# Patient Record
Sex: Female | Born: 1985 | Race: White | Hispanic: No | Marital: Single | Smoking: Former smoker
Health system: Southern US, Community
[De-identification: ages and names within clinical notes are randomized; demographics above are authoritative.]

## PROBLEM LIST (undated history)

## (undated) ENCOUNTER — Inpatient Hospital Stay (HOSPITAL_COMMUNITY): Payer: Self-pay

## (undated) DIAGNOSIS — R87629 Unspecified abnormal cytological findings in specimens from vagina: Secondary | ICD-10-CM

## (undated) DIAGNOSIS — R519 Headache, unspecified: Secondary | ICD-10-CM

## (undated) DIAGNOSIS — R51 Headache: Secondary | ICD-10-CM

## (undated) HISTORY — PX: KNEE SURGERY: SHX244

## (undated) HISTORY — DX: Unspecified abnormal cytological findings in specimens from vagina: R87.629

---

## 2014-09-15 ENCOUNTER — Encounter (HOSPITAL_COMMUNITY): Payer: Self-pay | Admitting: Emergency Medicine

## 2014-09-15 ENCOUNTER — Emergency Department (HOSPITAL_COMMUNITY)
Admission: EM | Admit: 2014-09-15 | Discharge: 2014-09-15 | Disposition: A | Discharge status: Home / Self Care | Payer: Self-pay | Attending: Emergency Medicine | Admitting: Emergency Medicine

## 2014-09-15 DIAGNOSIS — H9202 Otalgia, left ear: Secondary | ICD-10-CM | POA: Insufficient documentation

## 2014-09-15 DIAGNOSIS — J029 Acute pharyngitis, unspecified: Secondary | ICD-10-CM

## 2014-09-15 DIAGNOSIS — Z720 Tobacco use: Secondary | ICD-10-CM | POA: Insufficient documentation

## 2014-09-15 DIAGNOSIS — R05 Cough: Secondary | ICD-10-CM | POA: Insufficient documentation

## 2014-09-15 DIAGNOSIS — Z880 Allergy status to penicillin: Secondary | ICD-10-CM | POA: Insufficient documentation

## 2014-09-15 DIAGNOSIS — R0981 Nasal congestion: Secondary | ICD-10-CM | POA: Insufficient documentation

## 2014-09-15 LAB — RAPID STREP SCREEN (MED CTR MEBANE ONLY): Streptococcus, Group A Screen (Direct): NEGATIVE

## 2014-09-15 NOTE — ED Notes (Signed)
Patient here with complaint of right sided throat pain which began about 8 days ago. States that initially it was only her throat then her left ear began to hurt. States low grade fevers at onset, but they haven't returned. Right tonsil appears swollen with white streaks. Afebrile in triage.

## 2014-09-15 NOTE — ED Provider Notes (Signed)
CSN: [id number]     Arrival date & time 09/15/14  1853 History  This chart was scribed for Joycie Peek, PA-C working with No att. providers found by Elveria Rising, ED Scribe. This patient was seen in room TR08C/TR08C and the patient's care was started at 7:48 PM.   Chief Complaint  Patient presents with  . Sore Throat  . Otalgia   The history is provided by the patient. No language interpreter was used.   HPI Comments: Monica Le is a 29 y.o. female who presents to the Emergency Department complaining of burning sore throat, onset eight days ago. Patient reports onset of associated left ear pain six days ago, resolved low grade fevers, congestion and cough. Patient reports treatment with ibuprofen and Dayquil, without relief. Patient reports that she suspected her symptoms were viral and that she was trying to wait it out, but after it seemed that her other symptoms were improving her sore throat has worsened over the last two days. Denies any difficulties swallowing, breathing. Denies difficulty moving jaw   History reviewed. No pertinent past medical history. History reviewed. No pertinent past surgical history. History reviewed. No pertinent family history. History  Substance Use Topics  . Smoking status: Current Some Day Smoker  . Smokeless tobacco: Not on file  . Alcohol Use: No   OB History    No data available     Review of Systems  Constitutional: Negative for fever and chills.  HENT: Positive for congestion.   Respiratory: Positive for cough. Negative for shortness of breath.   Cardiovascular: Negative for chest pain.  Gastrointestinal: Negative for abdominal pain.   Allergies  Penicillins  Home Medications   Prior to Admission medications   Not on File   Triage Vitals: BP 141/91 mmHg  Pulse 96  Temp(Src) 98.4 F (36.9 C) (Oral)  Resp 22  SpO2 98%  LMP 09/08/2014 (Exact Date) Physical Exam  Constitutional: She is oriented to person, place, and time. She  appears well-developed and well-nourished. No distress.  HENT:  Head: Normocephalic and atraumatic.  Mildly erythematous oropharynx. No tonsillar exudate. No unilateral tonsillar swelling. No trismus. No glossal elevation. No cervical adenopathy.  No auricular lymphadenopathy. No tenderness to mastoid.  Patient handling secretions well. Patent airway  Eyes: EOM are normal.  Neck: Neck supple. No tracheal deviation present.  Cardiovascular: Normal rate, regular rhythm, S1 normal, S2 normal and normal heart sounds.   Pulmonary/Chest: Effort normal and breath sounds normal. No respiratory distress.  Clear to auscultation bilaterally.   Musculoskeletal: Normal range of motion.  Lymphadenopathy:    She has no cervical adenopathy.  Neurological: She is alert and oriented to person, place, and time.  Skin: Skin is warm and dry.  Psychiatric: She has a normal mood and affect. Her behavior is normal.  Nursing note and vitals reviewed.   ED Course  Procedures (including critical care time)  COORDINATION OF CARE: 7:56 PM- Discussed treatment plan with patient at bedside and patient agreed to plan.   Labs Review Labs Reviewed  RAPID STREP SCREEN  CULTURE, GROUP A STREP    Imaging Review No results found.   EKG Interpretation None     Meds given in ED:  Medications - No data to display  There are no discharge medications for this patient.  Filed Vitals:   09/15/14 1914 09/15/14 2131  BP: 141/91 115/80  Pulse: 96 77  Temp: 98.4 F (36.9 C)   TempSrc: Oral   Resp: 22 16  SpO2:  98% 99%    MDM  Vitals stable - WNL -afebrile Pt resting comfortably in ED. PE--mildly erythematous oropharynx with no unilateral tonsillar swelling, glossal elevation or trismus. Labwork--rapid strep negative  DDX--patient likely with viral pharyngitis. No temperature, she does report mild cough, no cervical adenopathy, no tonsillar exudate's. No evidence of peritonsillar abscess or Ludwig angina.  Patient is stable to follow-up with her PCP. Discussed symptomatic care  I discussed all relevant lab findings and imaging results with pt and they verbalized understanding. Discussed f/u with PCP within 48 hrs and return precautions, pt very amenable to plan.  Final diagnoses:  Pharyngitis    I personally performed the services described in this documentation, which was scribed in my presence. The recorded information has been reviewed and is accurate.    Earle GellBenjamin W Gladevilleartner, PA-C 09/16/14 0127  Arby BarretteMarcy Pfeiffer, MD 09/18/14 [phone]

## 2014-09-15 NOTE — Discharge Instructions (Signed)
°  You were evaluated in the ED today for your sore throat. There is not appear to be an emergent or bacterial cause of your sore throat at this time. It is important for you to follow-up with your primary care, [facility] for further evaluation and management of your symptoms. You may continue to do symptomatic support with salt water gargles, cough drops and lozenges, Chloraseptic spray. Return to ED for new or worsening symptoms

## 2014-09-15 NOTE — ED Notes (Signed)
PA at BS.  

## 2014-09-18 LAB — CULTURE, GROUP A STREP

## 2014-12-19 ENCOUNTER — Encounter (HOSPITAL_COMMUNITY): Payer: Self-pay | Admitting: Emergency Medicine

## 2014-12-19 ENCOUNTER — Emergency Department (HOSPITAL_COMMUNITY)
Admission: EM | Admit: 2014-12-19 | Discharge: 2014-12-19 | Disposition: A | Discharge status: Home / Self Care | Payer: Self-pay | Attending: Emergency Medicine | Admitting: Emergency Medicine

## 2014-12-19 ENCOUNTER — Emergency Department (HOSPITAL_COMMUNITY): Payer: Self-pay

## 2014-12-19 DIAGNOSIS — Z880 Allergy status to penicillin: Secondary | ICD-10-CM | POA: Insufficient documentation

## 2014-12-19 DIAGNOSIS — Z3A08 8 weeks gestation of pregnancy: Secondary | ICD-10-CM | POA: Insufficient documentation

## 2014-12-19 DIAGNOSIS — F17210 Nicotine dependence, cigarettes, uncomplicated: Secondary | ICD-10-CM | POA: Insufficient documentation

## 2014-12-19 DIAGNOSIS — O210 Mild hyperemesis gravidarum: Secondary | ICD-10-CM | POA: Insufficient documentation

## 2014-12-19 DIAGNOSIS — O4690 Antepartum hemorrhage, unspecified, unspecified trimester: Secondary | ICD-10-CM

## 2014-12-19 DIAGNOSIS — O26899 Other specified pregnancy related conditions, unspecified trimester: Secondary | ICD-10-CM

## 2014-12-19 DIAGNOSIS — O209 Hemorrhage in early pregnancy, unspecified: Secondary | ICD-10-CM | POA: Insufficient documentation

## 2014-12-19 DIAGNOSIS — R102 Pelvic and perineal pain: Secondary | ICD-10-CM

## 2014-12-19 DIAGNOSIS — Z3491 Encounter for supervision of normal pregnancy, unspecified, first trimester: Secondary | ICD-10-CM

## 2014-12-19 DIAGNOSIS — O99331 Smoking (tobacco) complicating pregnancy, first trimester: Secondary | ICD-10-CM | POA: Insufficient documentation

## 2014-12-19 LAB — HCG, QUANTITATIVE, PREGNANCY: hCG, Beta Chain, Quant, S: 148617 m[IU]/mL — ABNORMAL HIGH (ref <5)

## 2014-12-19 LAB — WET PREP, GENITAL
CLUE CELLS WET PREP: NONE SEEN
TRICH WET PREP: NONE SEEN
Yeast Wet Prep HPF POC: NONE SEEN

## 2014-12-19 LAB — PREGNANCY, URINE: Preg Test, Ur: POSITIVE — AB

## 2014-12-19 MED ORDER — PRENATAL COMPLETE 14-0.4 MG PO TABS: ORAL_TABLET | ORAL | Status: AC

## 2014-12-19 NOTE — Discharge Instructions (Signed)
Vaginal Bleeding During Pregnancy, First Trimester  A small amount of bleeding (spotting) from the vagina is relatively common in early pregnancy. It usually stops on its own. Various things may cause bleeding or spotting in early pregnancy. Some bleeding may be related to the pregnancy, and some may not. In most cases, the bleeding is normal and is not a problem. However, bleeding can also be a sign of something serious. Be sure to tell your health care provider about any vaginal bleeding right away.  Some possible causes of vaginal bleeding during the first trimester include:  · Infection or inflammation of the cervix.  · Growths (polyps) on the cervix.  · Miscarriage or threatened miscarriage.  · Pregnancy tissue has developed outside of the uterus and in a fallopian tube (tubal pregnancy).  · Tiny cysts have developed in the uterus instead of pregnancy tissue (molar pregnancy).  HOME CARE INSTRUCTIONS   Watch your condition for any changes. The following actions may help to lessen any discomfort you are feeling:  · Follow your health care provider's instructions for limiting your activity. If your health care provider orders bed rest, you may need to stay in bed and only get up to use the bathroom. However, your health care provider may allow you to continue light activity.  · If needed, make plans for someone to help with your regular activities and responsibilities while you are on bed rest.  · Keep track of the number of pads you use each day, how often you change pads, and how soaked (saturated) they are. Write this down.  · Do not use tampons. Do not douche.  · Do not have sexual intercourse or orgasms until approved by your health care provider.  · If you pass any tissue from your vagina, save the tissue so you can show it to your health care provider.  · Only take over-the-counter or prescription medicines as directed by your health care provider.  · Do not take aspirin because it can make you  bleed.  · Keep all follow-up appointments as directed by your health care provider.  SEEK MEDICAL CARE IF:  · You have any vaginal bleeding during any part of your pregnancy.  · You have cramps or labor pains.  · You have a fever, not controlled by medicine.  SEEK IMMEDIATE MEDICAL CARE IF:   · You have severe cramps in your back or belly (abdomen).  · You pass large clots or tissue from your vagina.  · Your bleeding increases.  · You feel light-headed or weak, or you have fainting episodes.  · You have chills.  · You are leaking fluid or have a gush of fluid from your vagina.  · You pass out while having a bowel movement.  MAKE SURE YOU:  · Understand these instructions.  · Will watch your condition.  · Will get help right away if you are not doing well or get worse.  Document Released: 05/06/2005 Document Revised: 08/01/2013 Document Reviewed: 04/03/2013  ExitCare® Patient Information ©2015 [employer]. This information is not intended to replace advice given to you by your health care provider. Make sure you discuss any questions you have with your health care provider.

## 2014-12-19 NOTE — ED Notes (Signed)
Pt states that past couple weeks been having intermittent vaginal bleeding and thinks she is around 7-[redacted] weeks pregnant.   Pt states that today she she had light brownish color discharge and not enough to wear a pad with.  Pt states that she is having breast tenderness, worsening morning symptoms and fatigue as well over the past couple weeks.

## 2014-12-19 NOTE — ED Provider Notes (Signed)
CSN: [id number]     Arrival date & time 12/19/14  1444 History   First MD Initiated Contact with Patient 12/19/14 1705     Chief Complaint  Patient presents with  . Vaginal Bleeding  . Abdominal Cramping  . Routine Prenatal Visit  . Morning Sickness     (Consider location/radiation/quality/duration/timing/severity/associated sxs/prior Treatment) Patient is a 29 y.o. female presenting with vaginal bleeding. The history is provided by the patient. No language interpreter was used.  Vaginal Bleeding Quality:  Bright red Severity:  Moderate Onset quality:  Gradual Duration:  2 weeks Timing:  Constant Progression:  Worsening Chronicity:  New Number of pads used:  0 Number of tampons used:  0 Possible pregnancy: yes   Relieved by:  Nothing Worsened by:  Nothing tried Ineffective treatments:  None tried Associated symptoms: no fever   Risk factors: prior miscarriage   Risk factors: no bleeding disorder   Pt is early pregnant.  Pt reports she has been spotting for the past week  History reviewed. No pertinent past medical history. Past Surgical History  Procedure Laterality Date  . Cesarean section     No family history on file. History  Substance Use Topics  . Smoking status: Current Some Day Smoker  . Smokeless tobacco: Not on file  . Alcohol Use: No   OB History    Gravida Para Term Preterm AB TAB SAB Ectopic Multiple Living   1              Review of Systems  Constitutional: Negative for fever.  Genitourinary: Positive for vaginal bleeding.  All other systems reviewed and are negative.     Allergies  Penicillins  Home Medications   Prior to Admission medications   Medication Sig Start Date End Date Taking? Authorizing Provider  ibuprofen (ADVIL,MOTRIN) 200 MG tablet Take 200-400 mg by mouth every 6 (six) hours as needed.   Yes Historical Provider, MD   BP 140/79 mmHg  Pulse 81  Temp(Src) 98.2 F (36.8 C) (Oral)  Resp 18  Ht 5\' 6"  (1.676 m)  Wt 173  lb 4 oz (78.586 kg)  BMI 27.98 kg/m2  SpO2 100%  LMP 10/29/2014 Physical Exam  Constitutional: She appears well-developed and well-nourished.  HENT:  Head: Normocephalic.  Eyes: Conjunctivae are normal. Pupils are equal, round, and reactive to light.  Neck: Neck supple.  Cardiovascular: Normal rate and regular rhythm.   Pulmonary/Chest: Effort normal and breath sounds normal.  Abdominal: Soft.  Genitourinary: Vaginal discharge found.  Uterus enlarged, nontender, adnexa no masses  Musculoskeletal: Normal range of motion.  Neurological: She is alert.  Skin: Skin is warm.  Nursing note and vitals reviewed.   ED Course  Procedures (including critical care time) Labs Review Labs Reviewed  PREGNANCY, URINE - Abnormal; Notable for the following:    Preg Test, Ur POSITIVE (*)    All other components within normal limits  WET PREP, GENITAL  GC/CHLAMYDIA PROBE AMP ([place])    Imaging Review [place] Ob Comp Less 14 Wks  12/19/2014   CLINICAL DATA:  Vaginal bleeding  EXAM: OBSTETRIC <14 WK US AND TRANSVAGINAL OB US  TECHNIQUE: Both transabdominal and transvaginal ultrasound examinations were performed for complete evaluation of the gestation as well as the maternal uterus, adnexal regions, and pelvic cul-de-sac. Transvaginal technique was performed to assess early pregnancy.  COMPARISON:  None.  FINDINGS: Intrauterine gestational sac: Visualized/normal in shape.  Yolk sac:  Present  Embryo:  Present  Cardiac Activity: Present  Heart  Rate: 149  bpm  CRL:  13  mm   7 w   4 d                  US EDC: 08/03/2015  Maternal uterus/adnexae: There is a small subchorionic hemorrhage measuring 1.1 x 0.5 x 1.3 cm. The right ovary measures 5.1 x 3.3 x 3.7 cm. The left ovary is not visualized. There is no pelvic free fluid.  IMPRESSION: 1. Single live intrauterine pregnancy dating 7 weeks 4 days with an ultrasound EDC of 08/03/2015. 2. Small subchorionic hemorrhage measuring 1.1 x 0.5 x 1.3 cm.    Electronically Signed   By: Elige KoHetal  Patel   On: 12/19/2014 19:58   [place] Ob Transvaginal  12/19/2014   CLINICAL DATA:  Vaginal bleeding  EXAM: OBSTETRIC <14 WK US AND TRANSVAGINAL OB US  TECHNIQUE: Both transabdominal and transvaginal ultrasound examinations were performed for complete evaluation of the gestation as well as the maternal uterus, adnexal regions, and pelvic cul-de-sac. Transvaginal technique was performed to assess early pregnancy.  COMPARISON:  None.  FINDINGS: Intrauterine gestational sac: Visualized/normal in shape.  Yolk sac:  Present  Embryo:  Present  Cardiac Activity: Present  Heart Rate: 149  bpm  CRL:  13  mm   7 w   4 d                  US EDC: 08/03/2015  Maternal uterus/adnexae: There is a small subchorionic hemorrhage measuring 1.1 x 0.5 x 1.3 cm. The right ovary measures 5.1 x 3.3 x 3.7 cm. The left ovary is not visualized. There is no pelvic free fluid.  IMPRESSION: 1. Single live intrauterine pregnancy dating 7 weeks 4 days with an ultrasound EDC of 08/03/2015. 2. Small subchorionic hemorrhage measuring 1.1 x 0.5 x 1.3 cm.   Electronically Signed   By: Elige KoHetal  Patel   On: 12/19/2014 19:58     EKG Interpretation None      MDM  Pt advised of subchorionic hemorrhage.   [facility] if any problems.  Schedule prenatal care.   Final diagnoses:  Vaginal bleeding in pregnancy, first trimester        Elson AreasLeslie K Sofia, PA-C 12/19/14 2303  Linwood DibblesJon Knapp, MD 12/19/14 [phone]

## 2014-12-19 NOTE — ED Notes (Signed)
Vaginal discharge for a few days. Pt admits to possibility of being pregnant.

## 2014-12-20 LAB — GC/CHLAMYDIA PROBE AMP (~~LOC~~) NOT AT ARMC
CHLAMYDIA, DNA PROBE: NEGATIVE
Neisseria Gonorrhea: NEGATIVE

## 2015-02-28 ENCOUNTER — Encounter: Payer: Self-pay | Admitting: Family Medicine

## 2015-05-28 ENCOUNTER — Inpatient Hospital Stay (HOSPITAL_COMMUNITY)
Admission: AD | Admit: 2015-05-28 | Discharge: 2015-05-28 | Disposition: A | Discharge status: Home / Self Care | Payer: Medicaid Other | Source: Ambulatory Visit | Attending: Obstetrics and Gynecology | Admitting: Obstetrics and Gynecology

## 2015-05-28 ENCOUNTER — Encounter (HOSPITAL_COMMUNITY): Payer: Self-pay | Admitting: *Deleted

## 2015-05-28 DIAGNOSIS — O34219 Maternal care for unspecified type scar from previous cesarean delivery: Secondary | ICD-10-CM | POA: Insufficient documentation

## 2015-05-28 DIAGNOSIS — Z880 Allergy status to penicillin: Secondary | ICD-10-CM | POA: Diagnosis not present

## 2015-05-28 DIAGNOSIS — Z3A29 29 weeks gestation of pregnancy: Secondary | ICD-10-CM | POA: Insufficient documentation

## 2015-05-28 HISTORY — DX: Headache: R51

## 2015-05-28 HISTORY — DX: Headache, unspecified: R51.9

## 2015-05-28 MED ORDER — BETAMETHASONE SOD PHOS & ACET 6 (3-3) MG/ML IJ SUSP
12.0000 mg | Freq: Once | INTRAMUSCULAR | Status: AC
  Administered 2015-05-28: 12 mg via INTRAMUSCULAR
  Filled 2015-05-28: qty 2

## 2015-05-28 NOTE — Discharge Instructions (Signed)
Keep your scheduled appointment for prenatal care. °

## 2015-05-28 NOTE — Discharge Summary (Addendum)
Obstetric Discharge Summary  Date of admission:  05/28/2015  Date of discharge:  05/28/2015  Admission diagnosis:  [redacted] week gestation  Preterm contractions  Positive fetal fibronectin  History of prior cesarean delivery  Discharge diagnosis:  Same  Procedures this admission:  Nonstress test  Injection of betamethasone  History and Physical Exam   Ms. Monica Le is a 29 y.o. female, G1P0, at Unknown gestation, who presents for a nonstress test and for an injection of betamethasone. [facility]  Her pregnancy has been complicated by a history of a prior cesarean section for twin gestation. She was seen in the office for preterm contractions. Her fetal fibronectin test was positive. See history below.  OB History    Gravida Para Term Preterm AB TAB SAB Ectopic Multiple Living   1               No past medical history on file.  Prescriptions prior to admission  Medication Sig Dispense Refill Last Dose  . ibuprofen (ADVIL,MOTRIN) 200 MG tablet Take 200-400 mg by mouth every 6 (six) hours as needed.   Past Month at Unknown time  . Prenatal Vit-Fe Fumarate-FA (PRENATAL COMPLETE) 14-0.4 MG TABS One a day 60 each 2     Past Surgical History  Procedure Laterality Date  . Cesarean section      Allergies  Allergen Reactions  . Penicillins Anaphylaxis    Any type of "cillin"    Family History: family history is not on file.  Social History:  reports that she has been smoking.  She does not have any smokeless tobacco history on file. She reports that she does not drink alcohol or use illicit drugs.  Review of systems: Normal pregnancy complaints.  Admission Physical Exam:    There is no weight on file to calculate BMI.  Last menstrual period 10/29/2014.  HEENT:                 Within normal limits Chest:                   Clear Heart:                     Regular rate and rhythm Abdomen:             Gravid and nontender Extremities:          Grossly normal Neurologic exam: Grossly normal Pelvic exam:         Cervix: Closed and long in the office  Prenatal labs: ABO, Rh:               O-. The patient has received Rhophylac HBsAg:                   nonreactive HIV:                         nonreactive GBS:                       pending Rubella:                  immune RPR:                           Prenatal Transfer Tool  Maternal Diabetes: No Genetic Screening: Declined Maternal  Ultrasounds/Referrals: Normal Fetal Ultrasounds or other Referrals:  None Maternal Substance Abuse:  No Significant Maternal Medications:  Procardia Significant Maternal Lab Results:  Positive fetal fibronectin Other Comments:  The patient has received her flu shot and her TDAP vaccination  Assessment:  [redacted] week gestation  Preterm uterine contractions  Positive fetal fibronectin  No evidence of preterm labor  Plan:  The patient will receive betamethasone today and a second shot in 24 hours. Gonorrhea, Chlamydia, and beta strep culture sent. Preterm labor precautions given.  Discharge Information: Date: 05/28/2015 Activity: unrestricted Diet: routine Medications: Procardia as needed for contractions Condition: stable Instructions: Preterm labor precautions given Discharge to: home   Janine Limbo 05/28/2015, 6:32 PM   Addendum: 1610  Nurse call reports reactive NST Strip Reviewed 145 bpm, Mod Var, -Decels, +10x10 Accels Okay to discharge to home Nurse to give bleeding and labor precautions Patient to return, tomorrow, for 2nd BMZ dosage   Monica Merk LYNN MSN, CNM  05/28/2015 7:18 PM

## 2015-05-28 NOTE — Progress Notes (Signed)
Dr. Stefano GaulStringer in to talk with patient.

## 2015-05-28 NOTE — MAU Note (Signed)
Pt has +fFN, sent in for betamethasone, to return tomorrow for 2nd dose

## 2015-05-29 ENCOUNTER — Inpatient Hospital Stay (HOSPITAL_COMMUNITY)
Admission: AD | Admit: 2015-05-29 | Discharge: 2015-05-29 | Disposition: A | Discharge status: Home / Self Care | Payer: Medicaid Other | Source: Ambulatory Visit | Attending: Obstetrics and Gynecology | Admitting: Obstetrics and Gynecology

## 2015-05-29 DIAGNOSIS — Z3A31 31 weeks gestation of pregnancy: Secondary | ICD-10-CM | POA: Insufficient documentation

## 2015-05-29 LAB — CULTURE, OB URINE: Culture: NO GROWTH

## 2015-05-29 LAB — GC/CHLAMYDIA PROBE AMP (~~LOC~~) NOT AT ARMC
CHLAMYDIA, DNA PROBE: NEGATIVE
Neisseria Gonorrhea: NEGATIVE

## 2015-05-29 MED ORDER — BETAMETHASONE SOD PHOS & ACET 6 (3-3) MG/ML IJ SUSP
12.0000 mg | Freq: Once | INTRAMUSCULAR | Status: AC
  Administered 2015-05-29: 12 mg via INTRAMUSCULAR
  Filled 2015-05-29: qty 2

## 2015-07-03 ENCOUNTER — Other Ambulatory Visit: Payer: Self-pay | Admitting: Obstetrics and Gynecology

## 2015-07-17 ENCOUNTER — Inpatient Hospital Stay (HOSPITAL_COMMUNITY)
Admission: AD | Admit: 2015-07-17 | Discharge: 2015-07-20 | DRG: 766 | Disposition: A | Discharge status: Home / Self Care | Payer: Medicaid Other | Source: Ambulatory Visit | Attending: Obstetrics and Gynecology | Admitting: Obstetrics and Gynecology

## 2015-07-17 ENCOUNTER — Inpatient Hospital Stay (HOSPITAL_COMMUNITY): Payer: Medicaid Other

## 2015-07-17 ENCOUNTER — Encounter (HOSPITAL_COMMUNITY): Payer: Self-pay

## 2015-07-17 DIAGNOSIS — Z833 Family history of diabetes mellitus: Secondary | ICD-10-CM

## 2015-07-17 DIAGNOSIS — Z3A37 37 weeks gestation of pregnancy: Secondary | ICD-10-CM

## 2015-07-17 DIAGNOSIS — O34211 Maternal care for low transverse scar from previous cesarean delivery: Principal | ICD-10-CM | POA: Diagnosis present

## 2015-07-17 DIAGNOSIS — Z87891 Personal history of nicotine dependence: Secondary | ICD-10-CM

## 2015-07-17 DIAGNOSIS — Z6791 Unspecified blood type, Rh negative: Secondary | ICD-10-CM | POA: Diagnosis present

## 2015-07-17 DIAGNOSIS — O09219 Supervision of pregnancy with history of pre-term labor, unspecified trimester: Secondary | ICD-10-CM

## 2015-07-17 DIAGNOSIS — O09899 Supervision of other high risk pregnancies, unspecified trimester: Secondary | ICD-10-CM

## 2015-07-17 DIAGNOSIS — Z880 Allergy status to penicillin: Secondary | ICD-10-CM

## 2015-07-17 DIAGNOSIS — O09299 Supervision of pregnancy with other poor reproductive or obstetric history, unspecified trimester: Secondary | ICD-10-CM

## 2015-07-17 DIAGNOSIS — O26893 Other specified pregnancy related conditions, third trimester: Secondary | ICD-10-CM | POA: Diagnosis present

## 2015-07-17 DIAGNOSIS — Z8279 Family history of other congenital malformations, deformations and chromosomal abnormalities: Secondary | ICD-10-CM

## 2015-07-17 DIAGNOSIS — Z302 Encounter for sterilization: Secondary | ICD-10-CM

## 2015-07-17 DIAGNOSIS — Z98891 History of uterine scar from previous surgery: Secondary | ICD-10-CM

## 2015-07-17 LAB — URINALYSIS, ROUTINE W REFLEX MICROSCOPIC
BILIRUBIN URINE: NEGATIVE
Glucose, UA: NEGATIVE mg/dL
Hgb urine dipstick: NEGATIVE
KETONES UR: 15 mg/dL — AB
NITRITE: NEGATIVE
PH: 7 (ref 5.0–8.0)
Protein, ur: NEGATIVE mg/dL
Specific Gravity, Urine: 1.02 (ref 1.005–1.030)

## 2015-07-17 LAB — URINE MICROSCOPIC-ADD ON

## 2015-07-17 LAB — KLEIHAUER-BETKE STAIN
# Vials RhIg: 2
Fetal Cells %: 0.4 %
QUANTITATION FETAL HEMOGLOBIN: 20 mL

## 2015-07-17 MED ORDER — ACETAMINOPHEN 325 MG PO TABS: 650.0000 mg | ORAL_TABLET | ORAL | Status: DC | PRN

## 2015-07-17 MED ORDER — CALCIUM CARBONATE ANTACID 500 MG PO CHEW: 2.0000 | CHEWABLE_TABLET | ORAL | Status: DC | PRN

## 2015-07-17 MED ORDER — FENTANYL CITRATE (PF) 100 MCG/2ML IJ SOLN
50.0000 ug | INTRAMUSCULAR | Status: DC | PRN
  Administered 2015-07-17: 100 ug via INTRAVENOUS
  Filled 2015-07-17: qty 2

## 2015-07-17 MED ORDER — PRENATAL MULTIVITAMIN CH: 1.0000 | ORAL_TABLET | Freq: Every day | ORAL | Status: DC

## 2015-07-17 MED ORDER — DOCUSATE SODIUM 100 MG PO CAPS: 100.0000 mg | ORAL_CAPSULE | Freq: Every day | ORAL | Status: DC

## 2015-07-17 MED ORDER — ZOLPIDEM TARTRATE 5 MG PO TABS: 5.0000 mg | ORAL_TABLET | Freq: Every evening | ORAL | Status: DC | PRN

## 2015-07-17 MED ORDER — LACTATED RINGERS IV SOLN
INTRAVENOUS | Status: DC
  Administered 2015-07-17 – 2015-07-18 (×2): via INTRAVENOUS

## 2015-07-17 MED ORDER — LACTATED RINGERS IV BOLUS (SEPSIS)
500.0000 mL | Freq: Once | INTRAVENOUS | Status: AC
  Administered 2015-07-17: 500 mL via INTRAVENOUS

## 2015-07-17 MED ORDER — LACTATED RINGERS IV SOLN
INTRAVENOUS | Status: DC
  Administered 2015-07-18 (×3): via INTRAVENOUS
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
## 2015-07-18 ENCOUNTER — Encounter (HOSPITAL_COMMUNITY): Payer: Self-pay | Admitting: *Deleted

## 2015-07-18 ENCOUNTER — Inpatient Hospital Stay (HOSPITAL_COMMUNITY): Payer: Medicaid Other | Admitting: Anesthesiology

## 2015-07-18 ENCOUNTER — Encounter (HOSPITAL_COMMUNITY): Payer: Self-pay | Admitting: Anesthesiology

## 2015-07-18 ENCOUNTER — Encounter (HOSPITAL_COMMUNITY)
Admission: AD | Disposition: A | Discharge status: Home / Self Care | Payer: Self-pay | Source: Ambulatory Visit | Attending: Obstetrics and Gynecology

## 2015-07-18 DIAGNOSIS — Z880 Allergy status to penicillin: Secondary | ICD-10-CM | POA: Diagnosis not present

## 2015-07-18 DIAGNOSIS — Z3A37 37 weeks gestation of pregnancy: Secondary | ICD-10-CM | POA: Diagnosis not present

## 2015-07-18 DIAGNOSIS — Z6791 Unspecified blood type, Rh negative: Secondary | ICD-10-CM | POA: Diagnosis not present

## 2015-07-18 DIAGNOSIS — O26893 Other specified pregnancy related conditions, third trimester: Secondary | ICD-10-CM | POA: Diagnosis present

## 2015-07-18 DIAGNOSIS — O34211 Maternal care for low transverse scar from previous cesarean delivery: Secondary | ICD-10-CM | POA: Diagnosis present

## 2015-07-18 DIAGNOSIS — Z87891 Personal history of nicotine dependence: Secondary | ICD-10-CM | POA: Diagnosis not present

## 2015-07-18 DIAGNOSIS — Z302 Encounter for sterilization: Secondary | ICD-10-CM | POA: Diagnosis not present

## 2015-07-18 DIAGNOSIS — Z833 Family history of diabetes mellitus: Secondary | ICD-10-CM | POA: Diagnosis not present

## 2015-07-18 HISTORY — PX: TUBAL LIGATION: SHX77

## 2015-07-18 LAB — CBC
HEMATOCRIT: 36.2 % (ref 36.0–46.0)
HEMOGLOBIN: 12.5 g/dL (ref 12.0–15.0)
MCH: 32.1 pg (ref 26.0–34.0)
MCHC: 34.5 g/dL (ref 30.0–36.0)
MCV: 93.1 fL (ref 78.0–100.0)
Platelets: 148 10*3/uL — ABNORMAL LOW (ref 150–400)
RBC: 3.89 MIL/uL (ref 3.87–5.11)
RDW: 13.8 % (ref 11.5–15.5)
WBC: 10.9 10*3/uL — AB (ref 4.0–10.5)

## 2015-07-18 SURGERY — Surgical Case
Anesthesia: Spinal | Site: Abdomen

## 2015-07-18 MED ORDER — DIBUCAINE 1 % RE OINT: 1.0000 "application " | TOPICAL_OINTMENT | RECTAL | Status: DC | PRN

## 2015-07-18 MED ORDER — ACETAMINOPHEN 325 MG PO TABS: 650.0000 mg | ORAL_TABLET | ORAL | Status: DC | PRN

## 2015-07-18 MED ORDER — NALBUPHINE HCL 10 MG/ML IJ SOLN: 5.0000 mg | Freq: Once | INTRAMUSCULAR | Status: DC | PRN

## 2015-07-18 MED ORDER — ONDANSETRON HCL 4 MG/2ML IJ SOLN: 4.0000 mg | Freq: Three times a day (TID) | INTRAMUSCULAR | Status: DC | PRN

## 2015-07-18 MED ORDER — KETOROLAC TROMETHAMINE 30 MG/ML IJ SOLN: 30.0000 mg | Freq: Four times a day (QID) | INTRAMUSCULAR | Status: AC | PRN

## 2015-07-18 MED ORDER — LACTATED RINGERS IV SOLN
INTRAVENOUS | Status: DC
  Administered 2015-07-19: 02:00:00 via INTRAVENOUS

## 2015-07-18 MED ORDER — LANOLIN HYDROUS EX OINT: 1.0000 "application " | TOPICAL_OINTMENT | CUTANEOUS | Status: DC | PRN

## 2015-07-18 MED ORDER — SODIUM CHLORIDE 0.9 % IJ SOLN: 3.0000 mL | INTRAMUSCULAR | Status: DC | PRN

## 2015-07-18 MED ORDER — PROMETHAZINE HCL 25 MG/ML IJ SOLN: 6.2500 mg | INTRAMUSCULAR | Status: DC | PRN

## 2015-07-18 MED ORDER — DIPHENHYDRAMINE HCL 25 MG PO CAPS
25.0000 mg | ORAL_CAPSULE | Freq: Four times a day (QID) | ORAL | Status: DC | PRN
  Filled 2015-07-18: qty 1

## 2015-07-18 MED ORDER — OXYTOCIN 40 UNITS IN LACTATED RINGERS INFUSION - SIMPLE MED: 62.5000 mL/h | INTRAVENOUS | Status: AC

## 2015-07-18 MED ORDER — MEPERIDINE HCL 25 MG/ML IJ SOLN: 6.2500 mg | INTRAMUSCULAR | Status: DC | PRN

## 2015-07-18 MED ORDER — OXYCODONE-ACETAMINOPHEN 5-325 MG PO TABS
1.0000 | ORAL_TABLET | ORAL | Status: DC | PRN
  Administered 2015-07-19 – 2015-07-20 (×3): 1 via ORAL
  Filled 2015-07-18 (×3): qty 1

## 2015-07-18 MED ORDER — IBUPROFEN 600 MG PO TABS
600.0000 mg | ORAL_TABLET | Freq: Four times a day (QID) | ORAL | Status: DC
  Administered 2015-07-18 – 2015-07-20 (×7): 600 mg via ORAL
  Filled 2015-07-18 (×7): qty 1

## 2015-07-18 MED ORDER — MENTHOL 3 MG MT LOZG: 1.0000 | LOZENGE | OROMUCOSAL | Status: DC | PRN

## 2015-07-18 MED ORDER — ONDANSETRON HCL 4 MG/2ML IJ SOLN
INTRAMUSCULAR | Status: DC | PRN
Start: 2015-07-18 — End: 2015-07-18
  Administered 2015-07-18: 4 mg via INTRAVENOUS

## 2015-07-18 MED ORDER — NALOXONE HCL 0.4 MG/ML IJ SOLN: 0.4000 mg | INTRAMUSCULAR | Status: DC | PRN

## 2015-07-18 MED ORDER — NALOXONE HCL 2 MG/2ML IJ SOSY: 1.0000 ug/kg/h | PREFILLED_SYRINGE | INTRAVENOUS | Status: DC | PRN

## 2015-07-18 MED ORDER — FENTANYL CITRATE (PF) 100 MCG/2ML IJ SOLN
INTRAMUSCULAR | Status: DC | PRN
  Administered 2015-07-18: 10 ug via INTRATHECAL

## 2015-07-18 MED ORDER — SIMETHICONE 80 MG PO CHEW
80.0000 mg | CHEWABLE_TABLET | ORAL | Status: DC
  Administered 2015-07-18 – 2015-07-19 (×2): 80 mg via ORAL
  Filled 2015-07-18 (×2): qty 1

## 2015-07-18 MED ORDER — KETOROLAC TROMETHAMINE 30 MG/ML IJ SOLN
INTRAMUSCULAR | Status: AC
  Filled 2015-07-18: qty 1

## 2015-07-18 MED ORDER — OXYTOCIN 10 UNIT/ML IJ SOLN
INTRAMUSCULAR | Status: AC
  Filled 2015-07-18: qty 4

## 2015-07-18 MED ORDER — TETANUS-DIPHTH-ACELL PERTUSSIS 5-2.5-18.5 LF-MCG/0.5 IM SUSP: 0.5000 mL | Freq: Once | INTRAMUSCULAR | Status: DC

## 2015-07-18 MED ORDER — BUPIVACAINE IN DEXTROSE 0.75-8.25 % IT SOLN
INTRATHECAL | Status: DC | PRN
  Administered 2015-07-18: 1.7 mL via INTRATHECAL

## 2015-07-18 MED ORDER — ZOLPIDEM TARTRATE 5 MG PO TABS: 5.0000 mg | ORAL_TABLET | Freq: Every evening | ORAL | Status: DC | PRN

## 2015-07-18 MED ORDER — FENTANYL CITRATE (PF) 100 MCG/2ML IJ SOLN
INTRAMUSCULAR | Status: AC
  Filled 2015-07-18: qty 2

## 2015-07-18 MED ORDER — KETOROLAC TROMETHAMINE 30 MG/ML IJ SOLN
30.0000 mg | Freq: Four times a day (QID) | INTRAMUSCULAR | Status: AC | PRN
  Administered 2015-07-18: 30 mg via INTRAMUSCULAR

## 2015-07-18 MED ORDER — FENTANYL CITRATE (PF) 100 MCG/2ML IJ SOLN
25.0000 ug | INTRAMUSCULAR | Status: DC | PRN
  Administered 2015-07-18 (×2): 25 ug via INTRAVENOUS

## 2015-07-18 MED ORDER — SCOPOLAMINE 1 MG/3DAYS TD PT72
MEDICATED_PATCH | TRANSDERMAL | Status: AC
Start: 2015-07-18 — End: 2015-07-18
  Filled 2015-07-18: qty 1

## 2015-07-18 MED ORDER — PRENATAL MULTIVITAMIN CH
1.0000 | ORAL_TABLET | Freq: Every day | ORAL | Status: DC
  Administered 2015-07-19 – 2015-07-20 (×2): 1 via ORAL
  Filled 2015-07-18 (×2): qty 1

## 2015-07-18 MED ORDER — MORPHINE SULFATE (PF) 0.5 MG/ML IJ SOLN
INTRAMUSCULAR | Status: AC
  Filled 2015-07-18: qty 10

## 2015-07-18 MED ORDER — NALBUPHINE HCL 10 MG/ML IJ SOLN: 5.0000 mg | INTRAMUSCULAR | Status: DC | PRN

## 2015-07-18 MED ORDER — ACETAMINOPHEN 500 MG PO TABS
1000.0000 mg | ORAL_TABLET | Freq: Four times a day (QID) | ORAL | Status: AC
  Administered 2015-07-18: 1000 mg via ORAL
  Filled 2015-07-18: qty 2

## 2015-07-18 MED ORDER — OXYCODONE-ACETAMINOPHEN 5-325 MG PO TABS
2.0000 | ORAL_TABLET | ORAL | Status: DC | PRN
  Administered 2015-07-19: 2 via ORAL
  Filled 2015-07-18: qty 2

## 2015-07-18 MED ORDER — PHENYLEPHRINE 8 MG IN D5W 100 ML (0.08MG/ML) PREMIX OPTIME
INJECTION | INTRAVENOUS | Status: DC | PRN
  Administered 2015-07-18: 60 ug/min via INTRAVENOUS

## 2015-07-18 MED ORDER — SENNOSIDES-DOCUSATE SODIUM 8.6-50 MG PO TABS
2.0000 | ORAL_TABLET | ORAL | Status: DC
  Administered 2015-07-18 – 2015-07-19 (×2): 2 via ORAL
  Filled 2015-07-18 (×2): qty 2

## 2015-07-18 MED ORDER — SCOPOLAMINE 1 MG/3DAYS TD PT72
MEDICATED_PATCH | TRANSDERMAL | Status: DC | PRN
  Administered 2015-07-18: 1 via TRANSDERMAL

## 2015-07-18 MED ORDER — DIPHENHYDRAMINE HCL 25 MG PO CAPS: 25.0000 mg | ORAL_CAPSULE | ORAL | Status: DC | PRN

## 2015-07-18 MED ORDER — SCOPOLAMINE 1 MG/3DAYS TD PT72: 1.0000 | MEDICATED_PATCH | Freq: Once | TRANSDERMAL | Status: DC

## 2015-07-18 MED ORDER — MORPHINE SULFATE (PF) 0.5 MG/ML IJ SOLN
INTRAMUSCULAR | Status: DC | PRN
  Administered 2015-07-18: .2 mg via INTRATHECAL

## 2015-07-18 MED ORDER — GENTAMICIN SULFATE 40 MG/ML IJ SOLN
INTRAVENOUS | Status: AC
  Administered 2015-07-18: 115 mL via INTRAVENOUS
  Filled 2015-07-18: qty 9

## 2015-07-18 MED ORDER — SIMETHICONE 80 MG PO CHEW: 80.0000 mg | CHEWABLE_TABLET | ORAL | Status: DC | PRN

## 2015-07-18 MED ORDER — SODIUM CHLORIDE 0.9 % IR SOLN
Status: DC | PRN
  Administered 2015-07-18: 1000 mL

## 2015-07-18 MED ORDER — ONDANSETRON HCL 4 MG/2ML IJ SOLN
INTRAMUSCULAR | Status: AC
  Filled 2015-07-18: qty 2

## 2015-07-18 MED ORDER — SIMETHICONE 80 MG PO CHEW
80.0000 mg | CHEWABLE_TABLET | Freq: Three times a day (TID) | ORAL | Status: DC
  Administered 2015-07-19 – 2015-07-20 (×2): 80 mg via ORAL
  Filled 2015-07-18 (×2): qty 1

## 2015-07-18 MED ORDER — CITRIC ACID-SODIUM CITRATE 334-500 MG/5ML PO SOLN
30.0000 mL | Freq: Once | ORAL | Status: AC
  Administered 2015-07-18: 30 mL via ORAL
  Filled 2015-07-18: qty 15

## 2015-07-18 MED ORDER — WITCH HAZEL-GLYCERIN EX PADS: 1.0000 "application " | MEDICATED_PAD | CUTANEOUS | Status: DC | PRN

## 2015-07-18 MED ORDER — DIPHENHYDRAMINE HCL 50 MG/ML IJ SOLN: 12.5000 mg | INTRAMUSCULAR | Status: DC | PRN

## 2015-07-18 SURGICAL SUPPLY — 40 items
BENZOIN TINCTURE PRP APPL 2/3 (GAUZE/BANDAGES/DRESSINGS) ×4 IMPLANT
CLAMP CORD UMBIL (MISCELLANEOUS) IMPLANT
CLOSURE STERI STRIP 1/2 X4 (GAUZE/BANDAGES/DRESSINGS) ×4 IMPLANT
CLOTH BEACON ORANGE TIMEOUT ST (SAFETY) ×4 IMPLANT
CONTAINER PREFILL 10% NBF 15ML (MISCELLANEOUS) IMPLANT
DRAPE SHEET LG 3/4 BI-LAMINATE (DRAPES) IMPLANT
DRSG OPSITE POSTOP 4X10 (GAUZE/BANDAGES/DRESSINGS) ×4 IMPLANT
DURAPREP 26ML APPLICATOR (WOUND CARE) ×4 IMPLANT
ELECT REM PT RETURN 9FT ADLT (ELECTROSURGICAL) ×4
ELECTRODE REM PT RTRN 9FT ADLT (ELECTROSURGICAL) ×2 IMPLANT
EXTRACTOR VACUUM M CUP 4 TUBE (SUCTIONS) IMPLANT
EXTRACTOR VACUUM M CUP 4' TUBE (SUCTIONS)
GLOVE BIO SURGEON STRL SZ7.5 (GLOVE) ×4 IMPLANT
GLOVE BIOGEL PI IND STRL 7.0 (GLOVE) ×2 IMPLANT
GLOVE BIOGEL PI IND STRL 7.5 (GLOVE) ×2 IMPLANT
GLOVE BIOGEL PI INDICATOR 7.0 (GLOVE) ×2
GLOVE BIOGEL PI INDICATOR 7.5 (GLOVE) ×2
GOWN STRL REUS W/TWL LRG LVL3 (GOWN DISPOSABLE) ×8 IMPLANT
HEMOSTAT SURGICEL 2X3 (HEMOSTASIS) ×4 IMPLANT
KIT ABG SYR 3ML LUER SLIP (SYRINGE) IMPLANT
NEEDLE HYPO 25X5/8 SAFETYGLIDE (NEEDLE) IMPLANT
NS IRRIG 1000ML POUR BTL (IV SOLUTION) ×4 IMPLANT
PACK C SECTION WH (CUSTOM PROCEDURE TRAY) ×4 IMPLANT
PAD OB MATERNITY 4.3X12.25 (PERSONAL CARE ITEMS) ×4 IMPLANT
PENCIL SMOKE EVAC W/HOLSTER (ELECTROSURGICAL) ×4 IMPLANT
RTRCTR C-SECT PINK 25CM LRG (MISCELLANEOUS) ×4 IMPLANT
STRIP CLOSURE SKIN 1/2X4 (GAUZE/BANDAGES/DRESSINGS) ×3 IMPLANT
SUT CHROMIC 2 0 CT 1 (SUTURE) ×4 IMPLANT
SUT MNCRL AB 3-0 PS2 27 (SUTURE) ×4 IMPLANT
SUT PLAIN 0 NONE (SUTURE) IMPLANT
SUT PLAIN 2 0 XLH (SUTURE) ×4 IMPLANT
SUT VIC AB 0 CT1 36 (SUTURE) ×4 IMPLANT
SUT VIC AB 0 CTX 36 (SUTURE) ×6
SUT VIC AB 0 CTX36XBRD ANBCTRL (SUTURE) ×6 IMPLANT
SUT VIC AB 2-0 SH 27 (SUTURE) ×8
SUT VIC AB 2-0 SH 27XBRD (SUTURE) ×8 IMPLANT
SUT VIC AB 3-0 SH 27 (SUTURE) ×2
SUT VIC AB 3-0 SH 27X BRD (SUTURE) ×2 IMPLANT
TOWEL OR 17X24 6PK STRL BLUE (TOWEL DISPOSABLE) ×4 IMPLANT
TRAY FOLEY CATH SILVER 14FR (SET/KITS/TRAYS/PACK) ×4 IMPLANT
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
## 2015-07-18 NOTE — Transfer of Care (Signed)
Immediate Anesthesia Transfer of Care Note  Patient: Monica Le  Procedure(s) Performed: Procedure(s): CESAREAN SECTION (N/A) BILATERAL TUBAL LIGATION (Bilateral)  Patient Location: PACU  Anesthesia Type:Spinal  Level of Consciousness: awake, alert  and oriented  Airway & Oxygen Therapy: Patient Spontanous Breathing  Post-op Assessment: Report given to RN and Post -op Vital signs reviewed and stable  Post vital signs: Reviewed and stable  Last Vitals:  Filed Vitals:   07/18/15 0800 07/18/15 1157  BP: 119/86 114/60  Pulse: 70 84  Temp: 37.8 C 37.1 C  Resp: 20 18    Complications: No apparent anesthesia complications
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
## 2015-07-18 NOTE — Anesthesia Postprocedure Evaluation (Signed)
Anesthesia Post Note  Patient: Monica Le  Procedure(s) Performed: Procedure(s) (LRB): CESAREAN SECTION (N/A) BILATERAL TUBAL LIGATION (Bilateral)  Patient location during evaluation: PACU Anesthesia Type: General Level of consciousness: awake and alert Pain management: pain level controlled Vital Signs Assessment: post-procedure vital signs reviewed and stable Respiratory status: spontaneous breathing, nonlabored ventilation, respiratory function stable and patient connected to nasal cannula oxygen Cardiovascular status: blood pressure returned to baseline and stable Postop Assessment: no signs of nausea or vomiting Anesthetic complications: no    Last Vitals:  Filed Vitals:   07/18/15 1530 07/18/15 1600  BP: 108/59 105/67  Pulse: 55 54  Temp:    Resp: 17 17    Last Pain:  Filed Vitals:   07/18/15 1629  PainSc: 8                  Reino KentJudd, Monica Le

## 2015-07-18 NOTE — Progress Notes (Signed)
Dr. Su Hiltoberts @ bedside discussing POC with pt.  Plan is to proceed with Cesarean Section today.  Pt verbalized understanding and agreeable to POC.
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
## 2015-07-18 NOTE — Progress Notes (Signed)
Pt to OR via stretcher.

## 2015-07-18 NOTE — Anesthesia Procedure Notes (Signed)
Spinal Patient location during procedure: OR Staffing Anesthesiologist: Claudell Wohler Performed by: anesthesiologist  Preanesthetic Checklist Completed: patient identified, site marked, surgical consent, pre-op evaluation, timeout performed, IV checked, risks and benefits discussed and monitors and equipment checked Spinal Block Patient position: sitting Prep: DuraPrep Patient monitoring: heart rate, continuous pulse ox and blood pressure Location: L4-5 Injection technique: single-shot Needle Needle type: Spinocan  Needle gauge: 24 G Needle length: 9 cm Assessment Sensory level: T6 Additional Notes Functioning IV was confirmed and monitors were applied. Sterile prep and drape, including hand hygiene, mask and sterile gloves were used. The patient was positioned and the spine was prepped. The skin was anesthetized with lidocaine.  Free flow of clear CSF was obtained prior to injecting local anesthetic into the CSF.  The spinal needle aspirated freely following injection.  The needle was carefully withdrawn.  The patient tolerated the procedure well. Consent was obtained prior to procedure with all questions answered and concerns addressed.  Monica DessertBen Ashby Moskal, MD

## 2015-07-19 ENCOUNTER — Encounter (HOSPITAL_COMMUNITY): Payer: Self-pay | Admitting: Obstetrics and Gynecology

## 2015-07-19 DIAGNOSIS — Z98891 History of uterine scar from previous surgery: Secondary | ICD-10-CM

## 2015-07-19 LAB — CBC
HEMATOCRIT: 32 % — AB (ref 36.0–46.0)
Hemoglobin: 10.8 g/dL — ABNORMAL LOW (ref 12.0–15.0)
MCH: 31.6 pg (ref 26.0–34.0)
MCHC: 33.8 g/dL (ref 30.0–36.0)
MCV: 93.6 fL (ref 78.0–100.0)
PLATELETS: 127 10*3/uL — AB (ref 150–400)
RBC: 3.42 MIL/uL — AB (ref 3.87–5.11)
RDW: 13.9 % (ref 11.5–15.5)
WBC: 9.8 10*3/uL (ref 4.0–10.5)

## 2015-07-19 LAB — RPR: RPR: NONREACTIVE

## 2015-07-19 LAB — HIV ANTIBODY (ROUTINE TESTING W REFLEX): HIV SCREEN 4TH GENERATION: NONREACTIVE

## 2015-07-19 MED ORDER — LACTATED RINGERS IV BOLUS (SEPSIS)
500.0000 mL | Freq: Once | INTRAVENOUS | Status: AC
  Administered 2015-07-19: 500 mL via INTRAVENOUS
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
## 2015-07-19 NOTE — Lactation Note (Addendum)
This note was copied from the chart of [name]. Lactation Consultation Note  P4, 37 weeks.  > 7 lbs..  Baby 19 hours. Reviewed hand expression w/ mother and she was able to express drops from both breasts. States she has history of low milk supply and had cracks and severe soreness w/ previous children latching. Attempted latching with and without NS but baby at this time does not open wide enough to latch. Reviewed sucking training and jaw massage. Mother has not started pumping.  Encouraged mother to post pump w/ DEBP 10-15 min at least 4-6x a day to stimulate her milk supply. Helped mother pump for 15 min.  Drops received put on spoon for baby. Set up soapy cleaning bin for mother. Placed baby STS on mother's chest.  1400 Mother is in a lot of pain when baby latches.  Attempted with and without NS. PrUniversity OrMarland KitchenFelicity Coyererith formulaCreTampa VMarland Kitche58mnSt [facility] [id number] 2016     Maternal Data    Feeding Feeding Type: Breast Fed  LATCH Score/Interventions                      Lactation Tools Discussed/Used     Consult Status      Berkelhammer, Ruth Boschen 07/19/2015, 9:50 AM

## 2015-07-20 MED ORDER — IBUPROFEN 600 MG PO TABS: 600.0000 mg | ORAL_TABLET | Freq: Four times a day (QID) | ORAL | Status: AC | PRN

## 2015-07-20 MED ORDER — OXYCODONE-ACETAMINOPHEN 5-325 MG PO TABS: 1.0000 | ORAL_TABLET | ORAL | Status: DC | PRN

## 2015-07-20 MED ORDER — SENNOSIDES-DOCUSATE SODIUM 8.6-50 MG PO TABS: 2.0000 | ORAL_TABLET | Freq: Every evening | ORAL | Status: AC | PRN
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
## 2015-07-20 NOTE — Discharge Instructions (Signed)
Postpartum Depression and Baby Blues °The postpartum period begins right after the birth of a baby. During this time, there is often a great amount of joy and excitement. It is also a time of many changes in the life of the parents. Regardless of how many times a mother gives birth, each child brings new challenges and dynamics to the family. It is not unusual to have feelings of excitement along with confusing shifts in moods, emotions, and thoughts. All mothers are at risk of developing postpartum depression or the "baby blues." These mood changes can occur right after giving birth, or they may occur many months after giving birth. The baby blues or postpartum depression can be mild or severe. Additionally, postpartum depression can go away rather quickly, or it can be a long-term condition.  °CAUSES °Raised hormone levels and the rapid drop in those levels are thought to be a main cause of postpartum depression and the baby blues. A number of hormones change during and after pregnancy. Estrogen and progesterone usually decrease right after the delivery of your baby. The levels of thyroid hormone and various cortisol steroids also rapidly drop. Other factors that play a role in these mood changes include major life events and genetics.  °RISK FACTORS °If you have any of the following risks for the baby blues or postpartum depression, know what symptoms to watch out for during the postpartum period. Risk factors that may increase the likelihood of getting the baby blues or postpartum depression include: °· Having a personal or family history of depression.   °· Having depression while being pregnant.   °· Having premenstrual mood issues or mood issues related to oral contraceptives. °· Having a lot of life stress.   °· Having marital conflict.   °· Lacking a social support network.   °· Having a baby with special needs.   °· Having health problems, such as diabetes.   °SIGNS AND SYMPTOMS °Symptoms of baby blues  include: °· Brief changes in mood, such as going from extreme happiness to sadness. °· Decreased concentration.   °· Difficulty sleeping.   °· Crying spells, tearfulness.   °· Irritability.   °· Anxiety.   °Symptoms of postpartum depression typically begin within the first month after giving birth. These symptoms include: °· Difficulty sleeping or excessive sleepiness.   °· Marked weight loss.   °· Agitation.   °· Feelings of worthlessness.   °· Lack of interest in activity or food.   °Postpartum psychosis is a very serious condition and can be dangerous. Fortunately, it is rare. Displaying any of the following symptoms is cause for immediate medical attention. Symptoms of postpartum psychosis include:  °· Hallucinations and delusions.   °· Bizarre or disorganized behavior.   °· Confusion or disorientation.   °DIAGNOSIS  °A diagnosis is made by an evaluation of your symptoms. There are no medical or lab tests that lead to a diagnosis, but there are various questionnaires that a health care provider may use to identify those with the baby blues, postpartum depression, or psychosis. Often, a screening tool called the Edinburgh Postnatal Depression Scale is used to diagnose depression in the postpartum period.  °TREATMENT °The baby blues usually goes away on its own in 1-2 weeks. Social support is often all that is needed. You will be encouraged to get adequate sleep and rest. Occasionally, you may be given medicines to help you sleep.  °Postpartum depression requires treatment because it can last several months or longer if it is not treated. Treatment may include individual or group therapy, medicine, or both to address any social, physiological, and psychological   factors that may play a role in the depression. Regular exercise, a healthy diet, rest, and social support may also be strongly recommended.  °Postpartum psychosis is more serious and needs treatment right away. Hospitalization is often needed. °HOME CARE  INSTRUCTIONS °· Get as much rest as you can. Nap when the baby sleeps.   °· Exercise regularly. Some women find yoga and walking to be beneficial.   °· Eat a balanced and nourishing diet.   °· Do little things that you enjoy. Have a cup of tea, take a bubble bath, read your favorite magazine, or listen to your favorite music. °· Avoid alcohol.   °· Ask for help with household chores, cooking, grocery shopping, or running errands as needed. Do not try to do everything.   °· Talk to people close to you about how you are feeling. Get support from your partner, family members, friends, or other new moms. °· Try to stay positive in how you think. Think about the things you are grateful for.   °· Do not spend a lot of time alone.   °· Only take over-the-counter or prescription medicine as directed by your health care provider. °· Keep all your postpartum appointments.   °· Let your health care provider know if you have any concerns.   °SEEK MEDICAL CARE IF: °You are having a reaction to or problems with your medicine. °SEEK IMMEDIATE MEDICAL CARE IF: °· You have suicidal feelings.   °· You think you may harm the baby or someone else. °MAKE SURE YOU: °· Understand these instructions. °· Will watch your condition. °· Will get help right away if you are not doing well or get worse. °  °This information is not intended to replace advice given to you by your health care provider. Make sure you discuss any questions you have with your health care provider. °  °Document Released: 04/30/2004 Document Revised: 08/01/2013 Document Reviewed: 05/08/2013 °Elsevier Interactive Patient Education ©2016 [employer] ° °

## 2015-07-20 NOTE — Lactation Note (Signed)
This note was copied from the chart of Monica Le. Lactation Consultation Note  Patient Name: Monica Mendel CorningChelsie Mccollum UJWJX'BToday's Date: 07/20/2015 Reason for consult: Follow-up assessment;Difficult latch;Other (Comment);Pump rental (Nipple Shield use, 37 week infant)   Follow up with mom prior to D/C. Infant with 5 BF for 15-30 minutes, 1 attempt, 6 bottle feedings of Alimentum 20 cal 10-45 cc, 4 voids and 3 stools in last 24 hours. Infant born at 5437 week gestation and weighs 6 lb 14.4 oz today with weight loss of 4% since birth. Mom wished to BF and notes that she had milk supply issues with previous children. She noted that she did have breast changes with pregnancy, denies changes in breast since delivery. Breasts are small and compressible and slightly cone/tubular, nipples are flat. Positional stripes noted on both nipples. Mom reports she uses NS with each BF and that infant is doing a little better with getting her mouth open a little wider today and that pain is decreased when infant able to obtain a deeper latch.  Mom has pumped 4 x in last 24 hours, she has not received any colostrum via pump. Mom has breast shells at bedside and has not used them. She is using comfort gels. Discussed using expressed colostrum and olive or coconut oil to nipples when not using comfort gels. Mom is concerned with milk supply and wishes to rent a DEBP. Enc her to BF and or pump 8-12 x in 24 hours to provide adequate stimulation to breasts. [facility] and plans to call Monday and make appt with WIC. WIC referral form filled out with mom's assistance [facility] Made mom a F/U LC Appointment on 12/189 at 9 am with mom's permission. Reviewed BF information in Taking Care of Baby and Me Booklet. Reviewed LC Services and gave mom OP appointment reminder. Mom to call with questions/concerns.    Maternal Data Formula Feeding for Exclusion: No Has patient been taught Hand Expression?:  Yes Does the patient have breastfeeding experience prior to this delivery?: Yes  Feeding Feeding Type: Formula Length of feed: 20 min  LATCH Score/Interventions                      Lactation Tools Discussed/Used WIC Program: Yes Pump Review: Setup, frequency, and cleaning;Milk Storage   Consult Status Consult Status: Follow-up Date: 07/29/15 (9 am) Follow-up type: Out-patient    Silas FloodSharon S Blaire Palomino 07/20/2015, 11:59 AM

## 2015-07-21 LAB — TYPE AND SCREEN
ABO/RH(D): O NEG
Antibody Screen: POSITIVE
DAT, IgG: NEGATIVE
Unit division: 0
Unit division: 0

## 2015-07-29 ENCOUNTER — Ambulatory Visit (HOSPITAL_COMMUNITY): Payer: Medicaid Other

## 2015-07-30 ENCOUNTER — Inpatient Hospital Stay (HOSPITAL_COMMUNITY): Admission: RE | Admit: 2015-07-30 | Payer: Medicaid Other | Source: Ambulatory Visit

## 2015-08-01 ENCOUNTER — Inpatient Hospital Stay (HOSPITAL_COMMUNITY)
Admission: RE | Admit: 2015-08-01 | Payer: Medicaid Other | Source: Ambulatory Visit | Admitting: Obstetrics and Gynecology

## 2015-08-01 ENCOUNTER — Encounter (HOSPITAL_COMMUNITY): Admission: RE | Payer: Self-pay | Source: Ambulatory Visit

## 2015-08-01 SURGERY — Surgical Case
Anesthesia: Regional | Laterality: Bilateral

## 2017-01-03 IMAGING — US US OB COMP LESS 14 WK
1 series · 14 of 28 positions shown · non-contrast
Comparison: None.

CLINICAL DATA: Vaginal bleeding

EXAM:
OBSTETRIC <14 WK US AND TRANSVAGINAL OB US
TECHNIQUE: Both transabdominal and transvaginal ultrasound examinations were
performed for complete evaluation of the gestation as well as the
maternal uterus, adnexal regions, and pelvic cul-de-sac.
Transvaginal technique was performed to assess early pregnancy.

[Series 1: us ob comp less 14 wk · 0.21mm/px · 14 of 111 slices shown]
[im 5/111]
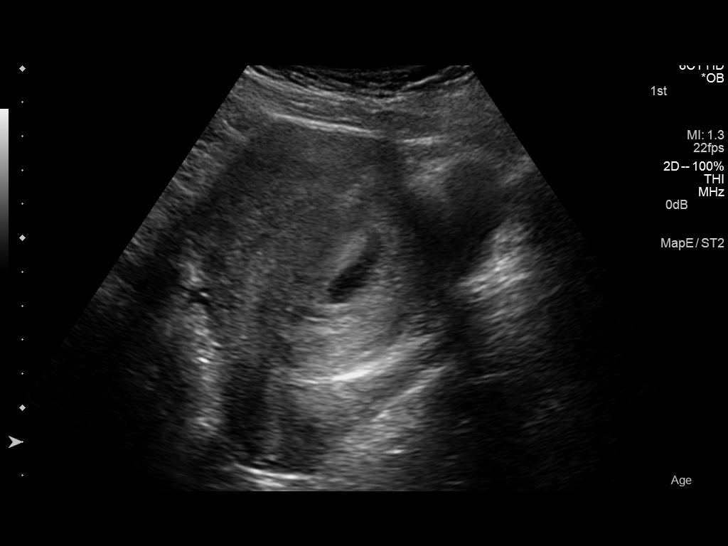
[im 13/111]
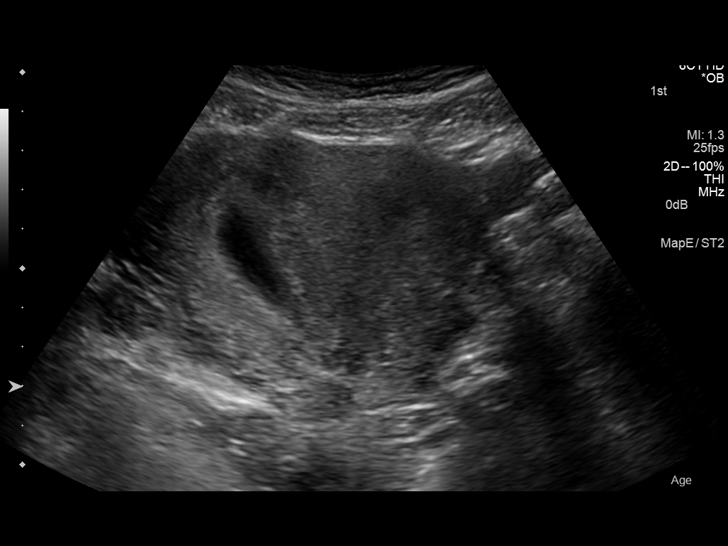
[im 21/111]
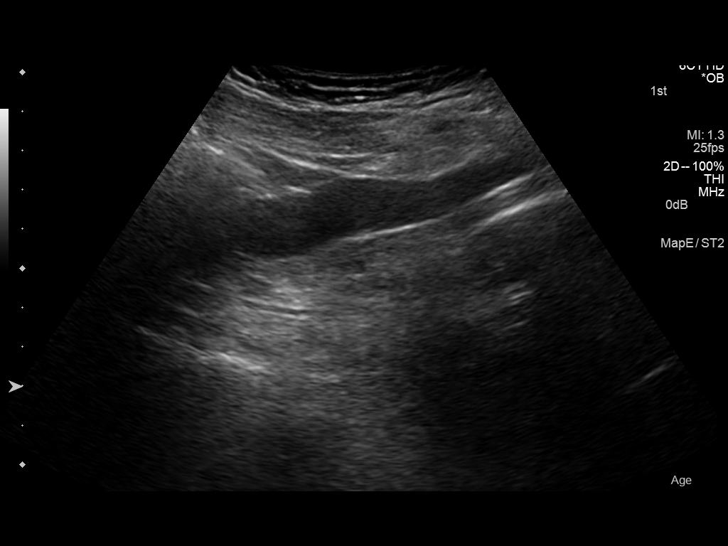
[im 29/111]
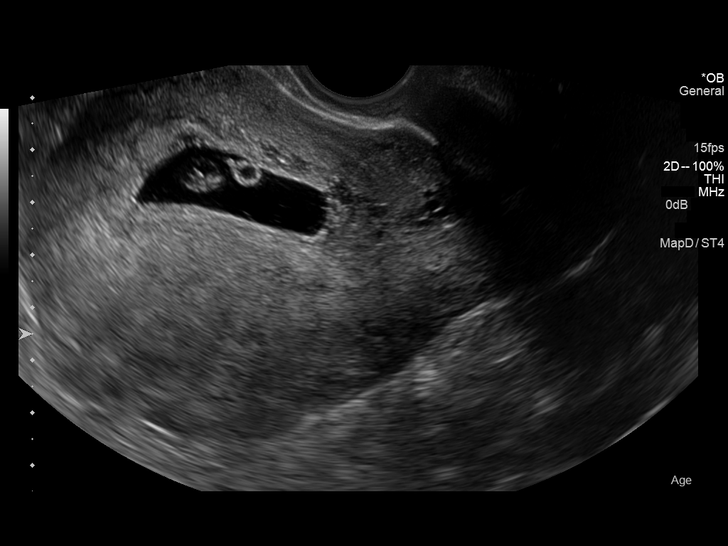
[im 37/111]
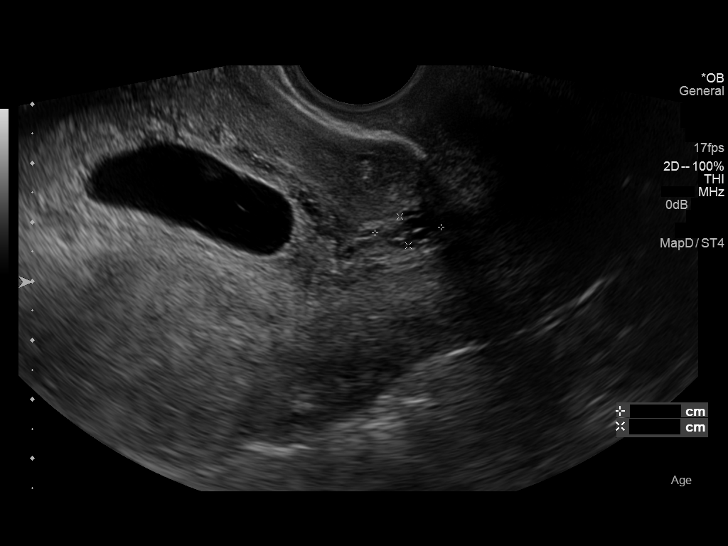
[im 45/111]
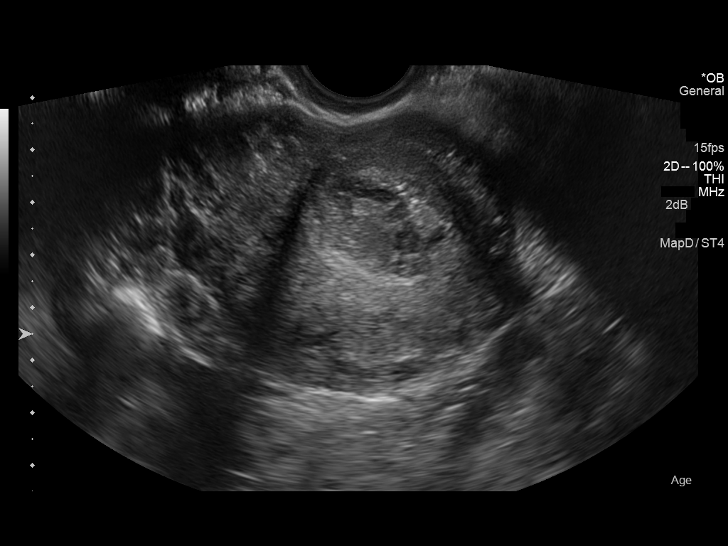
[im 53/111]
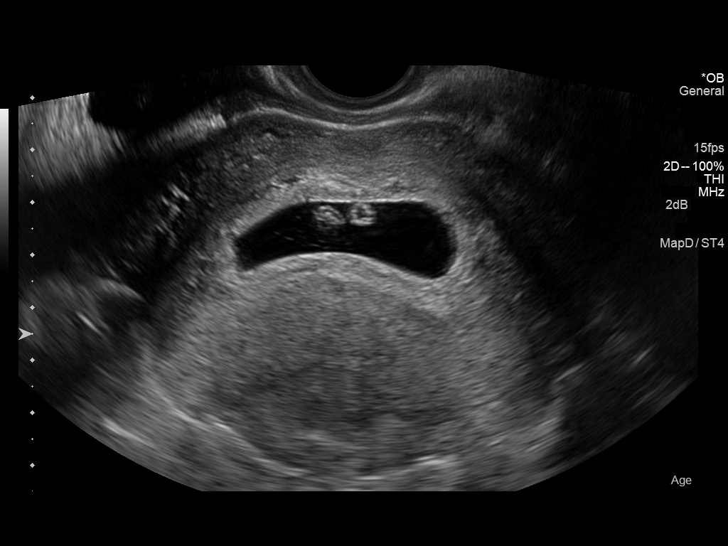
[im 62/111]
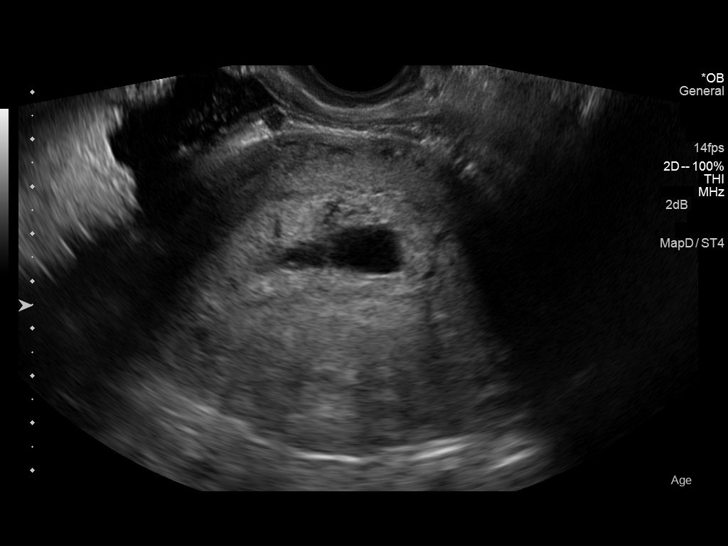
[im 70/111]
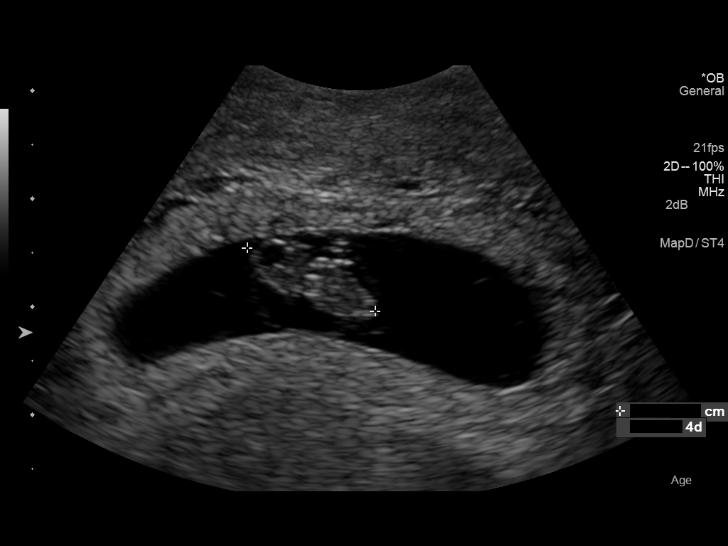
[im 78/111]
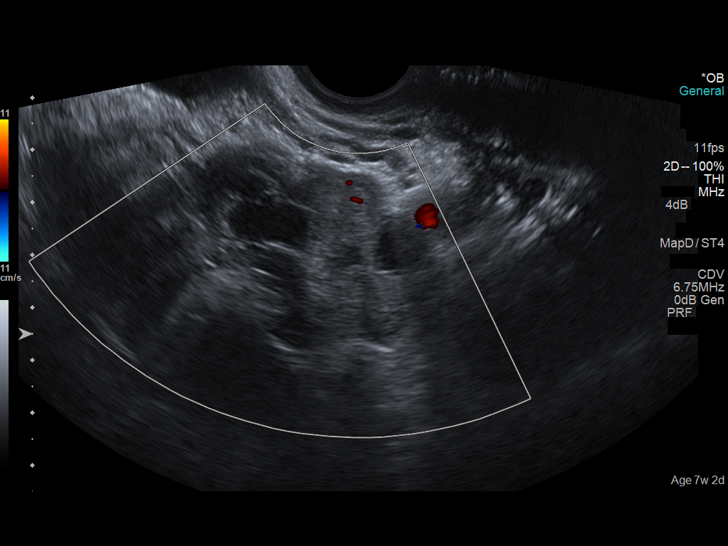
[im 86/111]
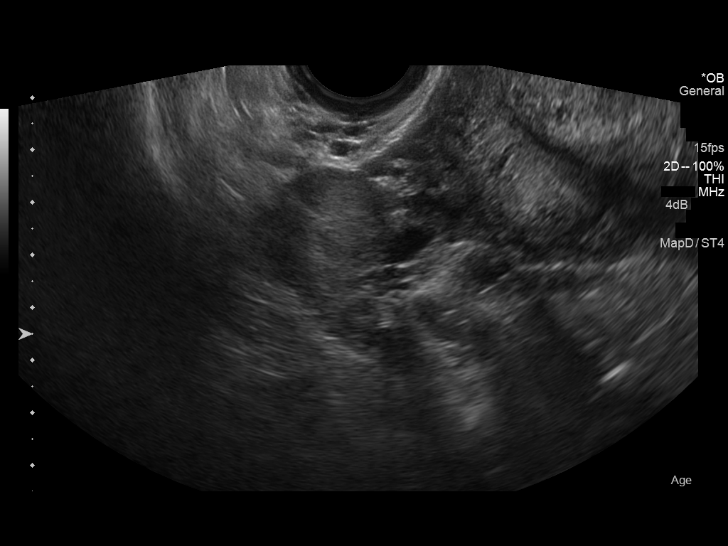
[im 94/111]
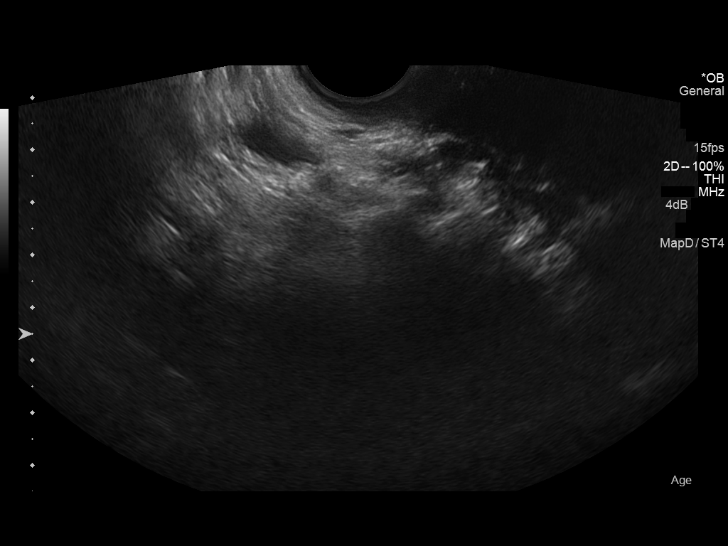
[im 102/111]
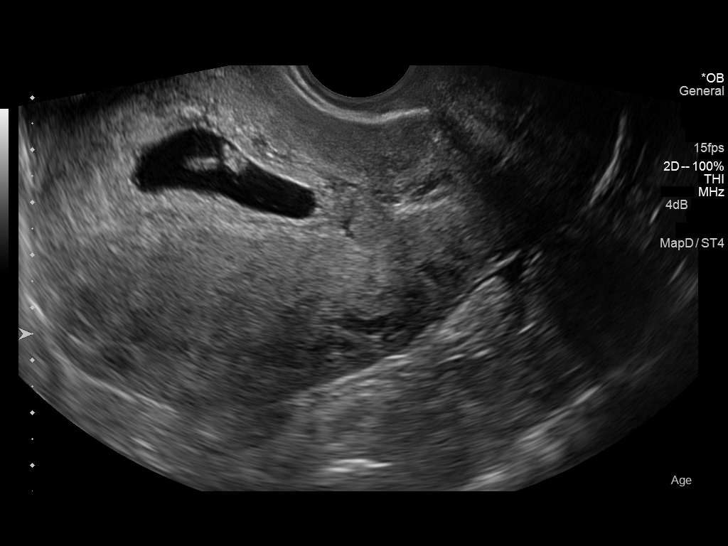
[im 111/111]
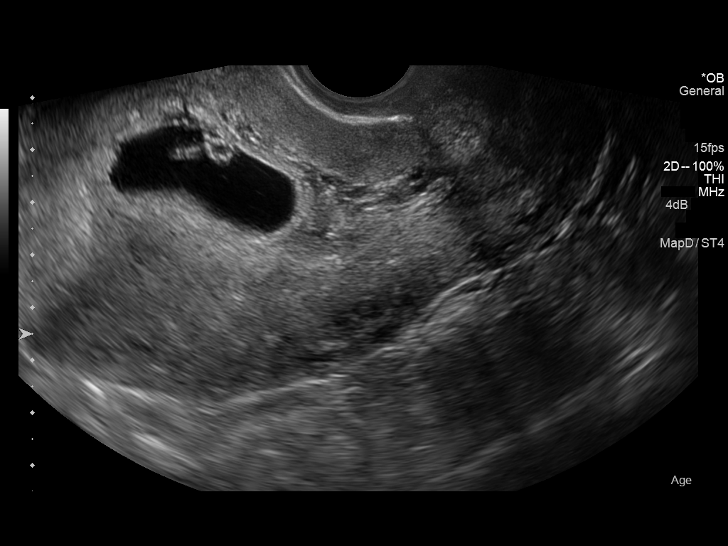

[14 of 28 positions shown; findings below may reference images not displayed]

FINDINGS: Intrauterine gestational sac: Visualized/normal in shape.

Yolk sac:  Present

Embryo:  Present

Cardiac Activity: Present

Heart Rate: 149  bpm

CRL:  13  mm   7 w   4 d                  US EDC: 08/03/2015

Maternal uterus/adnexae: There is a small subchorionic hemorrhage
measuring 1.1 x 0.5 x 1.3 cm. The right ovary measures 5.1 x 3.3 x
3.7 cm. The left ovary is not visualized. There is no pelvic free
fluid.
IMPRESSION: 1. Single live intrauterine pregnancy dating 7 weeks 4 days with an
ultrasound EDC of 08/03/2015.
2. Small subchorionic hemorrhage measuring 1.1 x 0.5 x 1.3 cm.

## 2023-09-30 ENCOUNTER — Encounter (HOSPITAL_BASED_OUTPATIENT_CLINIC_OR_DEPARTMENT_OTHER): Payer: Self-pay

## 2023-09-30 ENCOUNTER — Emergency Department (HOSPITAL_BASED_OUTPATIENT_CLINIC_OR_DEPARTMENT_OTHER): Payer: Medicaid Other

## 2023-09-30 ENCOUNTER — Other Ambulatory Visit: Payer: Self-pay

## 2023-09-30 ENCOUNTER — Emergency Department (HOSPITAL_BASED_OUTPATIENT_CLINIC_OR_DEPARTMENT_OTHER)
Admission: EM | Admit: 2023-09-30 | Discharge: 2023-09-30 | Disposition: A | Discharge status: Home / Self Care | Payer: Medicaid Other | Attending: Emergency Medicine | Admitting: Emergency Medicine

## 2023-09-30 DIAGNOSIS — R110 Nausea: Secondary | ICD-10-CM | POA: Insufficient documentation

## 2023-09-30 DIAGNOSIS — R1031 Right lower quadrant pain: Secondary | ICD-10-CM | POA: Insufficient documentation

## 2023-09-30 DIAGNOSIS — D72829 Elevated white blood cell count, unspecified: Secondary | ICD-10-CM | POA: Diagnosis not present

## 2023-09-30 DIAGNOSIS — R140 Abdominal distension (gaseous): Secondary | ICD-10-CM | POA: Insufficient documentation

## 2023-09-30 LAB — CBC
HCT: 39.2 % (ref 36.0–46.0)
Hemoglobin: 12.3 g/dL (ref 12.0–15.0)
MCH: 26 pg (ref 26.0–34.0)
MCHC: 31.4 g/dL (ref 30.0–36.0)
MCV: 82.9 fL (ref 80.0–100.0)
Platelets: 302 10*3/uL (ref 150–400)
RBC: 4.73 MIL/uL (ref 3.87–5.11)
RDW: 17.4 % — ABNORMAL HIGH (ref 11.5–15.5)
WBC: 12 10*3/uL — ABNORMAL HIGH (ref 4.0–10.5)
nRBC: 0 % (ref 0.0–0.2)

## 2023-09-30 LAB — URINALYSIS, MICROSCOPIC (REFLEX): Bacteria, UA: NONE SEEN

## 2023-09-30 LAB — COMPREHENSIVE METABOLIC PANEL
ALT: 18 U/L (ref 0–44)
AST: 22 U/L (ref 15–41)
Albumin: 3.9 g/dL (ref 3.5–5.0)
Alkaline Phosphatase: 54 U/L (ref 38–126)
Anion gap: 9 (ref 5–15)
BUN: 11 mg/dL (ref 6–20)
CO2: 25 mmol/L (ref 22–32)
Calcium: 9 mg/dL (ref 8.9–10.3)
Chloride: 105 mmol/L (ref 98–111)
Creatinine, Ser: 0.78 mg/dL (ref 0.44–1.00)
GFR, Estimated: >60 mL/min (ref >60)
Glucose, Bld: 102 mg/dL — ABNORMAL HIGH (ref 70–99)
Potassium: 3.7 mmol/L (ref 3.5–5.1)
Sodium: 139 mmol/L (ref 135–145)
Total Bilirubin: 0.6 mg/dL (ref 0.0–1.2)
Total Protein: 7.1 g/dL (ref 6.5–8.1)

## 2023-09-30 LAB — URINALYSIS, ROUTINE W REFLEX MICROSCOPIC
Bilirubin Urine: NEGATIVE
Glucose, UA: NEGATIVE mg/dL
Ketones, ur: NEGATIVE mg/dL
Leukocytes,Ua: NEGATIVE
Nitrite: NEGATIVE
Protein, ur: NEGATIVE mg/dL
Specific Gravity, Urine: 1.015 (ref 1.005–1.030)
pH: 7 (ref 5.0–8.0)

## 2023-09-30 LAB — LIPASE, BLOOD: Lipase: 30 U/L (ref 11–51)

## 2023-09-30 LAB — PREGNANCY, URINE: Preg Test, Ur: NEGATIVE

## 2023-09-30 MED ORDER — FENTANYL CITRATE PF 50 MCG/ML IJ SOSY
50.0000 ug | PREFILLED_SYRINGE | Freq: Once | INTRAMUSCULAR | Status: AC
  Administered 2023-09-30: 50 ug via INTRAVENOUS
  Filled 2023-09-30: qty 1

## 2023-09-30 MED ORDER — OXYCODONE HCL 5 MG PO TABS
10.0000 mg | ORAL_TABLET | Freq: Once | ORAL | Status: AC
  Administered 2023-09-30: 10 mg via ORAL
  Filled 2023-09-30: qty 2

## 2023-09-30 MED ORDER — IOHEXOL 300 MG/ML  SOLN
100.0000 mL | Freq: Once | INTRAMUSCULAR | Status: AC | PRN
  Administered 2023-09-30: 100 mL via INTRAVENOUS

## 2023-09-30 MED ORDER — ONDANSETRON HCL 4 MG/2ML IJ SOLN
4.0000 mg | Freq: Once | INTRAMUSCULAR | Status: AC
  Administered 2023-09-30: 4 mg via INTRAVENOUS
  Filled 2023-09-30: qty 2

## 2023-09-30 MED ORDER — KETOROLAC TROMETHAMINE 15 MG/ML IJ SOLN: 15.0000 mg | Freq: Once | INTRAMUSCULAR | Status: DC

## 2023-09-30 NOTE — ED Triage Notes (Signed)
 In for eval of RLQ abd pain, bloating, distension. Pain radiates to lower back and right leg. Woke her from sleep at 0500 with nausea and vomiting. Pain worse with movement and hitting the bumps on the road. LMP started 5 days ago.

## 2023-09-30 NOTE — ED Provider Notes (Signed)
 [facility] Provider Note   CSN: [id number] Arrival date & time: 09/30/23  1534     History Chief Complaint  Patient presents with   Abdominal Pain    Monica Le is a 38 y.o. female patient with history of ovarian cyst who presents to the emergency department today for further evaluation of right lower quadrant abdominal pain that woke her up from sleep around 5 AM.  She states it was very intense and sharp.  She states that is waxing and waning in nature.  She reports associated nausea without vomiting.  No diarrhea, fever, urinary symptoms.  Pain is now radiating into the right side of the back.  It is worse with movement and in the car ride on the way to the emergency room.  Patient is currently on her menses.   Abdominal Pain      Home Medications Prior to Admission medications   Medication Sig Start Date End Date Taking? Authorizing Provider  cetirizine (ZYRTEC) 10 MG tablet Take 10 mg by mouth daily.   Yes [provider]  ibuprofen (ADVIL,MOTRIN) 600 MG tablet Take 1 tablet (600 mg total) by mouth every 6 (six) hours as needed. 07/20/15   Gerrit Heck, CNM  oxyCODONE-acetaminophen (PERCOCET/ROXICET) 5-325 MG tablet Take 1-2 tablets by mouth every 4 (four) hours as needed (for pain scale greater than 7). 07/20/15   Gerrit Heck, CNM  Prenatal Vit-Fe Fumarate-FA (PRENATAL COMPLETE) 14-0.4 MG TABS One a day Patient taking differently: Take 2 tablets by mouth daily. One a day 12/19/14   Elson Areas, PA-C  senna-docusate (SENOKOT-S) 8.6-50 MG tablet Take 2 tablets by mouth at bedtime as needed for mild constipation. 07/20/15   Gerrit Heck, CNM      Allergies    Penicillins and Procardia [nifedipine]    Review of Systems   Review of Systems  Gastrointestinal:  Positive for abdominal pain.  All other systems reviewed and are negative.   Physical Exam Updated Vital Signs BP 123/74 (BP Location: Right Arm)    Pulse 91   Temp 99 F (37.2 C) (Oral)   Resp 18   Ht 5\' 6"  (1.676 m)   Wt 79.4 kg   LMP 09/25/2023 (Approximate)   SpO2 100%   Breastfeeding No   BMI 28.25 kg/m  Physical Exam Vitals and nursing note reviewed.  Constitutional:      General: She is not in acute distress.    Appearance: Normal appearance.  HENT:     Head: Normocephalic and atraumatic.  Eyes:     General:        Right eye: No discharge.        Left eye: No discharge.  Cardiovascular:     Comments: Regular rate and rhythm.  S1/S2 are distinct without any evidence of murmur, rubs, or gallops.  Radial pulses are 2+ bilaterally.  Dorsalis pedis pulses are 2+ bilaterally.  No evidence of pedal edema. Pulmonary:     Comments: Clear to auscultation bilaterally.  Normal effort.  No respiratory distress.  No evidence of wheezes, rales, or rhonchi heard throughout. Abdominal:     General: Abdomen is flat. Bowel sounds are normal. There is no distension.     Tenderness: There is abdominal tenderness in the right lower quadrant. There is no guarding or rebound. Positive signs include Rovsing's sign and psoas sign.  Musculoskeletal:        General: Normal range of motion.     Cervical back:  Neck supple.  Skin:    General: Skin is warm and dry.     Findings: No rash.  Neurological:     General: No focal deficit present.     Mental Status: She is alert.  Psychiatric:        Mood and Affect: Mood normal.        Behavior: Behavior normal.     ED Results / Procedures / Treatments   Labs (all labs ordered are listed, but only abnormal results are displayed) Labs Reviewed  COMPREHENSIVE METABOLIC PANEL - Abnormal; Notable for the following components:      Result Value   Glucose, Bld 102 (*)    All other components within normal limits  CBC - Abnormal; Notable for the following components:   WBC 12.0 (*)    RDW 17.4 (*)    All other components within normal limits  URINALYSIS, ROUTINE W REFLEX MICROSCOPIC - Abnormal;  Notable for the following components:   Hgb urine dipstick MODERATE (*)    All other components within normal limits  LIPASE, BLOOD  PREGNANCY, URINE  URINALYSIS, MICROSCOPIC (REFLEX)    EKG None  Radiology CT ABDOMEN PELVIS W CONTRAST Result Date: 09/30/2023 CLINICAL DATA:  RLQ abdominal pain EXAM: CT ABDOMEN AND PELVIS WITH CONTRAST TECHNIQUE: Multidetector CT imaging of the abdomen and pelvis was performed using the standard protocol following bolus administration of intravenous contrast. RADIATION DOSE REDUCTION: This exam was performed according to the departmental dose-optimization program which includes automated exposure control, adjustment of the mA and/or kV according to patient size and/or use of iterative reconstruction technique. CONTRAST:  OMNIPAQUE IOHEXOL 300 MG/ML  SOLN COMPARISON:  None Available. FINDINGS: Lower chest: No acute abnormality. Hepatobiliary: No focal liver abnormality. No gallstones, gallbladder wall thickening, or pericholecystic fluid. No biliary dilatation. Pancreas: No focal lesion. Normal pancreatic contour. No surrounding inflammatory changes. No main pancreatic ductal dilatation. Spleen: Normal in size without focal abnormality. Adrenals/Urinary Tract: No adrenal nodule bilaterally. Bilateral kidneys enhance symmetrically. No hydronephrosis. No hydroureter. The urinary bladder is unremarkable. Stomach/Bowel: Stomach is within normal limits. No evidence of bowel wall thickening or dilatation. Appendix appears normal. Vascular/Lymphatic: No abdominal aorta or iliac aneurysm. No abdominal, pelvic, or inguinal lymphadenopathy. Reproductive: Uterus and bilateral adnexa are unremarkable. Other: No intraperitoneal free fluid. No intraperitoneal free gas. No organized fluid collection. Musculoskeletal: No abdominal wall hernia or abnormality. No suspicious lytic or blastic osseous lesions. No acute displaced fracture. IMPRESSION: No acute intra-abdominal or  intrapelvic abnormality. Electronically Signed   By: Tish Frederickson M.D.   On: 09/30/2023 18:16    Procedures Procedures    Medications Ordered in ED Medications  oxyCODONE (Oxy IR/ROXICODONE) immediate release tablet 10 mg (has no administration in time range)  ondansetron (ZOFRAN) injection 4 mg (4 mg Intravenous Given 09/30/23 1731)  fentaNYL (SUBLIMAZE) injection 50 mcg (50 mcg Intravenous Given 09/30/23 1731)  iohexol (OMNIPAQUE) 300 MG/ML solution 100 mL (100 mLs Intravenous Contrast Given 09/30/23 1802)    ED Course/ Medical Decision Making/ A&P Clinical Course as of 09/30/23 1850  Thu Sep 30, 2023  1722 StableRLQ pain. Very sudden and tender. [CC]  1831 I spoke with general surgery and they are not concerned for appendicitis at this time.  I was recommended to look at other etiologies.  There is no evidence of free fluid on CT imaging and pain is improving thus making ovarian torsion less likely at this time. [CF]  1835 CBC(!) There is evidence of leukocytosis. [CF]  1835  Lipase, blood Negative. [CF]  1835 Urinalysis, Routine w reflex microscopic -Urine, Clean Catch(!) Negative. [CF]  1835 Comprehensive metabolic panel(!) Negative. [CF]  1837 [email or web address] In a nonpregnant adult with clinical signs and symptoms of appendicitis, CT has a sensitivity of 95% and specificity of 94%. Therefore, the probability of having appendicitis following a positive CT result is 92%, and the probability of having appendicitis following a negative CT result is 4%. These conclusions are based on studies of low methodologic quality.1 (Strength of Recommendation: B, based on inconsistent or limited-quality patient-oriented evidence.)  Consult to Gen surg recommended and completed per PA. Gen surg stated to consider other etiology over appendicitis. Given no cyst, free fluid or other ovarian abnormalities on imaging torsion considered less likely. Vitals  stable. [CC]  1843 Went back and discussed with the patient the bedside.  Will give her p.o. oxycodone and plan to discharge.  Patient agreeable with plan.  I given strict return precautions including if pain is persistent and/or immediately gets worse, she spikes fevers, or has any other concerns that she should present to [facility] or [facility].  They expressed full understanding. [CF]    Clinical Course User Index [CC] Glyn Ade, MD [CF] Teressa Lower, PA-C   {   Click here for ABCD2, HEART and other calculators  Medical Decision Making Troyce Febo is a 38 y.o. female patient who presents to the emergency department today for further evaluation of right lower quadrant abdominal pain.  Differential diagnosis does include but not limited to appendicitis, ureterolithiasis, cystitis, pyelonephritis, ruptured ovarian cyst.  Will plan to get a CT scan.  Initial labs do show that the patient does have a leukocytosis.  I am also going to give her some pain medication as well.  Plan to discharge home.  CT scan was negative.  I consulted general surgery who is less concern for appendicitis at this time.  Patient received strict return precautions.  She is safe for discharge at this time.  Amount and/or Complexity of Data Reviewed Labs: ordered. Decision-making details documented in ED Course. Radiology: ordered.  Risk Prescription drug management.    Final Clinical Impression(s) / ED Diagnoses Final diagnoses:  RLQ abdominal pain    Rx / DC Orders ED Discharge Orders     None         Teressa Lower, [place] 09/30/23 1851    Glyn Ade, MD 09/30/23 2236

## 2023-09-30 NOTE — Discharge Instructions (Signed)
 As we discussed, CT scan was negative.  Would like for you to keep a close tab on your pain level.  If you are experiencing an increased level of pain, start spiking fevers, or have any other concerns I would present to [facility] or [facility] for further evaluation.

## 2023-10-01 ENCOUNTER — Emergency Department (HOSPITAL_COMMUNITY)
Admission: EM | Admit: 2023-10-01 | Discharge: 2023-10-01 | Disposition: A | Discharge status: Home / Self Care | Payer: Medicaid Other | Attending: Emergency Medicine | Admitting: Emergency Medicine

## 2023-10-01 ENCOUNTER — Other Ambulatory Visit: Payer: Self-pay

## 2023-10-01 ENCOUNTER — Emergency Department (HOSPITAL_COMMUNITY): Payer: Medicaid Other

## 2023-10-01 DIAGNOSIS — N80122 Deep endometriosis of left ovary: Secondary | ICD-10-CM | POA: Insufficient documentation

## 2023-10-01 DIAGNOSIS — R102 Pelvic and perineal pain: Secondary | ICD-10-CM

## 2023-10-01 DIAGNOSIS — N80129 Deep endometriosis of ovary, unspecified ovary: Secondary | ICD-10-CM

## 2023-10-01 DIAGNOSIS — R109 Unspecified abdominal pain: Secondary | ICD-10-CM | POA: Diagnosis present

## 2023-10-01 LAB — CBC WITH DIFFERENTIAL/PLATELET
Abs Immature Granulocytes: 0.02 10*3/uL (ref 0.00–0.07)
Basophils Absolute: 0 10*3/uL (ref 0.0–0.1)
Basophils Relative: 1 %
Eosinophils Absolute: 0.1 10*3/uL (ref 0.0–0.5)
Eosinophils Relative: 2 %
HCT: 37.9 % (ref 36.0–46.0)
Hemoglobin: 12.1 g/dL (ref 12.0–15.0)
Immature Granulocytes: 0 %
Lymphocytes Relative: 29 %
Lymphs Abs: 1.7 10*3/uL (ref 0.7–4.0)
MCH: 26.5 pg (ref 26.0–34.0)
MCHC: 31.9 g/dL (ref 30.0–36.0)
MCV: 83.1 fL (ref 80.0–100.0)
Monocytes Absolute: 0.4 10*3/uL (ref 0.1–1.0)
Monocytes Relative: 7 %
Neutro Abs: 3.6 10*3/uL (ref 1.7–7.7)
Neutrophils Relative %: 61 %
Platelets: 301 10*3/uL (ref 150–400)
RBC: 4.56 MIL/uL (ref 3.87–5.11)
RDW: 17.5 % — ABNORMAL HIGH (ref 11.5–15.5)
WBC: 6 10*3/uL (ref 4.0–10.5)
nRBC: 0 % (ref 0.0–0.2)

## 2023-10-01 LAB — BASIC METABOLIC PANEL
Anion gap: 10 (ref 5–15)
BUN: 10 mg/dL (ref 6–20)
CO2: 24 mmol/L (ref 22–32)
Calcium: 9.2 mg/dL (ref 8.9–10.3)
Chloride: 106 mmol/L (ref 98–111)
Creatinine, Ser: 0.74 mg/dL (ref 0.44–1.00)
GFR, Estimated: >60 mL/min (ref >60)
Glucose, Bld: 89 mg/dL (ref 70–99)
Potassium: 4.1 mmol/L (ref 3.5–5.1)
Sodium: 140 mmol/L (ref 135–145)

## 2023-10-01 MED ORDER — MORPHINE SULFATE (PF) 4 MG/ML IV SOLN
4.0000 mg | Freq: Once | INTRAVENOUS | Status: AC
  Administered 2023-10-01: 4 mg via INTRAVENOUS
  Filled 2023-10-01: qty 1

## 2023-10-01 MED ORDER — FENTANYL CITRATE PF 50 MCG/ML IJ SOSY
50.0000 ug | PREFILLED_SYRINGE | Freq: Once | INTRAMUSCULAR | Status: AC
  Administered 2023-10-01: 50 ug via INTRAVENOUS
  Filled 2023-10-01: qty 1

## 2023-10-01 MED ORDER — OXYCODONE-ACETAMINOPHEN 5-325 MG PO TABS: 1.0000 | ORAL_TABLET | Freq: Once | ORAL | Status: DC

## 2023-10-01 MED ORDER — KETOROLAC TROMETHAMINE 15 MG/ML IJ SOLN
15.0000 mg | Freq: Once | INTRAMUSCULAR | Status: AC
  Administered 2023-10-01: 15 mg via INTRAVENOUS
  Filled 2023-10-01: qty 1

## 2023-10-01 MED ORDER — OXYCODONE-ACETAMINOPHEN 5-325 MG PO TABS: 1.0000 | ORAL_TABLET | Freq: Three times a day (TID) | ORAL | 0 refills | Status: AC | PRN

## 2023-10-01 NOTE — ED Provider Notes (Signed)
 [place] EMERGENCY DEPARTMENT AT [facility] Provider Note   CSN: [id number] Arrival date & time: 10/01/23  1045     History  Chief Complaint  Patient presents with   Abdominal Pain    Monica Le is a 38 y.o. female.   Abdominal Pain Patient presents with abdominal pain.  Severe.  Right lower quadrant.  Began yesterday at 5 in the morning when it woke her up.  Has had pain since.  Got seen in the ER yesterday with negative CT scan.  It appears that those providers contacted general surgery.  White count mildly elevated.  Patient is currently on her menses.  States she has had ovarian cyst previously.  No adnexal abnormality seen on CT scan however.  No fevers.  Continued pain.  Worse with walking.  No dysuria.    Past Medical History:  Diagnosis Date   Headache    pregnancy induced    Home Medications Prior to Admission medications   Medication Sig Start Date End Date Taking? Authorizing Provider  oxyCODONE-acetaminophen (PERCOCET/ROXICET) 5-325 MG tablet Take 1-2 tablets by mouth every 8 (eight) hours as needed for severe pain (pain score 7-10). 10/01/23  Yes Benjiman Core, MD  cetirizine (ZYRTEC) 10 MG tablet Take 10 mg by mouth daily.    [provider]  ibuprofen (ADVIL,MOTRIN) 600 MG tablet Take 1 tablet (600 mg total) by mouth every 6 (six) hours as needed. 07/20/15   Gerrit Heck, CNM  Prenatal Vit-Fe Fumarate-FA (PRENATAL COMPLETE) 14-0.4 MG TABS One a day Patient taking differently: Take 2 tablets by mouth daily. One a day 12/19/14   Elson Areas, PA-C  senna-docusate (SENOKOT-S) 8.6-50 MG tablet Take 2 tablets by mouth at bedtime as needed for mild constipation. 07/20/15   Gerrit Heck, CNM      Allergies    Penicillins and Procardia [nifedipine]    Review of Systems   Review of Systems  Gastrointestinal:  Positive for abdominal pain.    Physical Exam Updated Vital Signs BP 98/63   Pulse 70   Temp 98 F (36.7 C) (Oral)    Resp 12   LMP 09/25/2023 (Approximate)   SpO2 98%  Physical Exam Vitals and nursing note reviewed.  HENT:     Head: Atraumatic.  Pulmonary:     Effort: Pulmonary effort is normal.  Abdominal:     Tenderness: There is abdominal tenderness.     Comments: Moderate lower abdominal tenderness.  Worse right lower quadrant but also somewhat pressure on the left lower quadrant.  Does have some rebound.  Skin:    General: Skin is warm.  Neurological:     Mental Status: She is alert.     ED Results / Procedures / Treatments   Labs (all labs ordered are listed, but only abnormal results are displayed) Labs Reviewed  CBC WITH DIFFERENTIAL/PLATELET - Abnormal; Notable for the following components:      Result Value   RDW 17.5 (*)    All other components within normal limits  BASIC METABOLIC PANEL    EKG None  Radiology US Pelvis Complete Result Date: 10/01/2023 CLINICAL DATA:  Right lower quadrant pain. EXAM: TRANSABDOMINAL AND TRANSVAGINAL ULTRASOUND OF PELVIS DOPPLER ULTRASOUND OF OVARIES TECHNIQUE: Both transabdominal and transvaginal ultrasound examinations of the pelvis were performed. Transabdominal technique was performed for global imaging of the pelvis including uterus, ovaries, adnexal regions, and pelvic cul-de-sac. It was necessary to proceed with endovaginal exam following the transabdominal exam to visualize the ovaries. Color  and duplex Doppler ultrasound was utilized to evaluate blood flow to the ovaries. COMPARISON:  CT abdomen pelvis from yesterday FINDINGS: Uterus Measurements: 9.3 x 5.4 x 7.0 cm = volume: 182 mL. No fibroids or other mass visualized. Endometrium Thickness: 4 mm.  No focal abnormality visualized. Right ovary Measurements: 3.7 x 2.1 x 2.0 cm = volume: 8.4 mL. Normal appearance/no adnexal mass. Left ovary Measurements: 4.4 x 2.7 x 3.5 cm = volume: 21.6 mL. 2.1 x 1.4 x 2.5 cm dominant follicle in the left ovary. Additional 1.6 x 1.9 cm cystic lesion with  homogeneous low-level internal echoes. Pulsed Doppler evaluation of both ovaries demonstrates normal low-resistance arterial and venous waveforms. Other findings No abnormal free fluid. IMPRESSION: 1. No acute abnormality. 2. 1.9 cm left ovarian cystic lesion with appearance suggestive of endometrioma. Follow-up pelvic ultrasound in 6-12 weeks is recommended. Electronically Signed   By: Obie Dredge M.D.   On: 10/01/2023 15:24   US Transvaginal Non-OB Result Date: 10/01/2023 CLINICAL DATA:  Right lower quadrant pain. EXAM: TRANSABDOMINAL AND TRANSVAGINAL ULTRASOUND OF PELVIS DOPPLER ULTRASOUND OF OVARIES TECHNIQUE: Both transabdominal and transvaginal ultrasound examinations of the pelvis were performed. Transabdominal technique was performed for global imaging of the pelvis including uterus, ovaries, adnexal regions, and pelvic cul-de-sac. It was necessary to proceed with endovaginal exam following the transabdominal exam to visualize the ovaries. Color and duplex Doppler ultrasound was utilized to evaluate blood flow to the ovaries. COMPARISON:  CT abdomen pelvis from yesterday FINDINGS: Uterus Measurements: 9.3 x 5.4 x 7.0 cm = volume: 182 mL. No fibroids or other mass visualized. Endometrium Thickness: 4 mm.  No focal abnormality visualized. Right ovary Measurements: 3.7 x 2.1 x 2.0 cm = volume: 8.4 mL. Normal appearance/no adnexal mass. Left ovary Measurements: 4.4 x 2.7 x 3.5 cm = volume: 21.6 mL. 2.1 x 1.4 x 2.5 cm dominant follicle in the left ovary. Additional 1.6 x 1.9 cm cystic lesion with homogeneous low-level internal echoes. Pulsed Doppler evaluation of both ovaries demonstrates normal low-resistance arterial and venous waveforms. Other findings No abnormal free fluid. IMPRESSION: 1. No acute abnormality. 2. 1.9 cm left ovarian cystic lesion with appearance suggestive of endometrioma. Follow-up pelvic ultrasound in 6-12 weeks is recommended. Electronically Signed   By: Obie Dredge M.D.   On:  10/01/2023 15:24   [place] Art/Ven Flow Abd Pelv Doppler Result Date: 10/01/2023 CLINICAL DATA:  Right lower quadrant pain. EXAM: TRANSABDOMINAL AND TRANSVAGINAL ULTRASOUND OF PELVIS DOPPLER ULTRASOUND OF OVARIES TECHNIQUE: Both transabdominal and transvaginal ultrasound examinations of the pelvis were performed. Transabdominal technique was performed for global imaging of the pelvis including uterus, ovaries, adnexal regions, and pelvic cul-de-sac. It was necessary to proceed with endovaginal exam following the transabdominal exam to visualize the ovaries. Color and duplex Doppler ultrasound was utilized to evaluate blood flow to the ovaries. COMPARISON:  CT abdomen pelvis from yesterday FINDINGS: Uterus Measurements: 9.3 x 5.4 x 7.0 cm = volume: 182 mL. No fibroids or other mass visualized. Endometrium Thickness: 4 mm.  No focal abnormality visualized. Right ovary Measurements: 3.7 x 2.1 x 2.0 cm = volume: 8.4 mL. Normal appearance/no adnexal mass. Left ovary Measurements: 4.4 x 2.7 x 3.5 cm = volume: 21.6 mL. 2.1 x 1.4 x 2.5 cm dominant follicle in the left ovary. Additional 1.6 x 1.9 cm cystic lesion with homogeneous low-level internal echoes. Pulsed Doppler evaluation of both ovaries demonstrates normal low-resistance arterial and venous waveforms. Other findings No abnormal free fluid. IMPRESSION: 1. No acute abnormality. 2. 1.9 cm  left ovarian cystic lesion with appearance suggestive of endometrioma. Follow-up pelvic ultrasound in 6-12 weeks is recommended. Electronically Signed   By: Obie Dredge M.D.   On: 10/01/2023 15:24   CT ABDOMEN PELVIS W CONTRAST Result Date: 09/30/2023 CLINICAL DATA:  RLQ abdominal pain EXAM: CT ABDOMEN AND PELVIS WITH CONTRAST TECHNIQUE: Multidetector CT imaging of the abdomen and pelvis was performed using the standard protocol following bolus administration of intravenous contrast. RADIATION DOSE REDUCTION: This exam was performed according to the departmental  dose-optimization program which includes automated exposure control, adjustment of the mA and/or kV according to patient size and/or use of iterative reconstruction technique. CONTRAST:  OMNIPAQUE IOHEXOL 300 MG/ML  SOLN COMPARISON:  None Available. FINDINGS: Lower chest: No acute abnormality. Hepatobiliary: No focal liver abnormality. No gallstones, gallbladder wall thickening, or pericholecystic fluid. No biliary dilatation. Pancreas: No focal lesion. Normal pancreatic contour. No surrounding inflammatory changes. No main pancreatic ductal dilatation. Spleen: Normal in size without focal abnormality. Adrenals/Urinary Tract: No adrenal nodule bilaterally. Bilateral kidneys enhance symmetrically. No hydronephrosis. No hydroureter. The urinary bladder is unremarkable. Stomach/Bowel: Stomach is within normal limits. No evidence of bowel wall thickening or dilatation. Appendix appears normal. Vascular/Lymphatic: No abdominal aorta or iliac aneurysm. No abdominal, pelvic, or inguinal lymphadenopathy. Reproductive: Uterus and bilateral adnexa are unremarkable. Other: No intraperitoneal free fluid. No intraperitoneal free gas. No organized fluid collection. Musculoskeletal: No abdominal wall hernia or abnormality. No suspicious lytic or blastic osseous lesions. No acute displaced fracture. IMPRESSION: No acute intra-abdominal or intrapelvic abnormality. Electronically Signed   By: Tish Frederickson M.D.   On: 09/30/2023 18:16    Procedures Procedures    Medications Ordered in ED Medications  ketorolac (TORADOL) 15 MG/ML injection 15 mg (15 mg Intravenous Given 10/01/23 1314)  fentaNYL (SUBLIMAZE) injection 50 mcg (50 mcg Intravenous Given 10/01/23 1441)  morphine (PF) 4 MG/ML injection 4 mg (4 mg Intravenous Given 10/01/23 1558)    ED Course/ Medical Decision Making/ A&P                                 Medical Decision Making Amount and/or Complexity of Data Reviewed Labs: ordered. Radiology:  ordered.  Risk Prescription drug management.   Patient with lower abdominal pain.  On menses.  Negative CT scan yesterday white count mildly elevated.  Differential diagnosis does include CT negative appendicitis.  Will recheck white count.  Also will get pelvic exam.  Other causes such ovarian cyst, torsion and endometriosis considered.  White count now improved.  Pelvic exam done and showed just some mild bleeding without infectious-like discharge.  Ultrasound done and showed potential endometrioma.  This would fit with the pain.  Will give pain medicine and follow-up with GYN.  Appears stable for discharge home.        Final Clinical Impression(s) / ED Diagnoses Final diagnoses:  Pelvic pain in female  Endometrioma    Rx / DC Orders ED Discharge Orders          Ordered    oxyCODONE-acetaminophen (PERCOCET/ROXICET) 5-325 MG tablet  Every 8 hours PRN        10/01/23 1552              Benjiman Core, MD 10/01/23 1603

## 2023-10-01 NOTE — ED Triage Notes (Signed)
 Pt. Stated, I woke up around with acute right side and lower abdominal pain. Went to [facility], Had blood work, CT scan and they said found nothing and said if it was worse to come here.

## 2023-10-01 NOTE — ED Notes (Signed)
 Pt currently in [place].

## 2023-10-14 ENCOUNTER — Encounter: Payer: Self-pay | Admitting: Obstetrics and Gynecology

## 2023-10-14 ENCOUNTER — Other Ambulatory Visit (HOSPITAL_COMMUNITY)
Admission: RE | Admit: 2023-10-14 | Discharge: 2023-10-14 | Disposition: A | Discharge status: Home / Self Care | Source: Ambulatory Visit | Attending: Obstetrics and Gynecology | Admitting: Obstetrics and Gynecology

## 2023-10-14 ENCOUNTER — Ambulatory Visit: Payer: Medicaid Other | Admitting: Obstetrics and Gynecology

## 2023-10-14 VITALS — BP 110/71 | HR 81 | Ht 66.0 in | Wt 176.0 lb

## 2023-10-14 DIAGNOSIS — Z1339 Encounter for screening examination for other mental health and behavioral disorders: Secondary | ICD-10-CM

## 2023-10-14 DIAGNOSIS — Z113 Encounter for screening for infections with a predominantly sexual mode of transmission: Secondary | ICD-10-CM | POA: Diagnosis not present

## 2023-10-14 DIAGNOSIS — Z01419 Encounter for gynecological examination (general) (routine) without abnormal findings: Secondary | ICD-10-CM | POA: Diagnosis present

## 2023-10-14 DIAGNOSIS — N946 Dysmenorrhea, unspecified: Secondary | ICD-10-CM

## 2023-10-14 DIAGNOSIS — N939 Abnormal uterine and vaginal bleeding, unspecified: Secondary | ICD-10-CM | POA: Diagnosis present

## 2023-10-14 DIAGNOSIS — Z3202 Encounter for pregnancy test, result negative: Secondary | ICD-10-CM

## 2023-10-14 LAB — POCT URINE PREGNANCY: Preg Test, Ur: NEGATIVE

## 2023-10-14 NOTE — Progress Notes (Signed)
 NEW GYNECOLOGY PATIENT Patient name: Monica Le MRN [id number]  Date of birth: 03-06-1986 Chief Complaint:   NEW PATIENT/ANNUAL     History:  Monica Le is a 38 y.o. [phone] being seen today for AUB, dysmenorrhea, and re-establish care.    Discussed the use of AI scribe software for clinical note transcription with the patient, who gave verbal consent to proceed.  History of Present Illness   Monica Le is a 38 year old female who presents for STI testing and evaluation of pelvic pain.  It has been a long time since her last visit, which was a post-check after the birth of her eight-year-old child. She has not had a Pap smear since then.  Recently, she visited the emergency department due to severe abdominal pain. Initially, appendicitis was suspected, but a CT scan ruled it out. An internal and external ultrasound revealed an endometrioma on her left ovary, which was believed to be the source of her pain. The pain has since subsided gradually over a few days.  She has a history of intense menstrual cycles, lasting eight to nine days with extremely heavy bleeding and large clots. Seven and a half years ago, there was consideration of endometriosis, but further investigation was halted due to loss of insurance. She is unable to use tampons due to the heaviness of her flow and relies on super plus overnight pads for three to four days during her cycle.  She has been on various forms of birth control in the past, including Depo and pills, but prefers to avoid them due to hormonal side effects. She has never used an IUD or Nexplanon. She is done having children and has had her tubes tied after her last C-section.  She describes the pain she experienced as more severe than labor, causing her to vomit and bringing her to her hands and knees. She is concerned about the possibility of endometriosis and is interested in exploring options to manage her heavy bleeding, such as ablation, as her  mother had similar issues and found relief with this procedure.           Gynecologic History Patient's last menstrual period was 09/25/2023 (exact date). Contraception: tubal ligation Last Pap: No results found for: "DIAGPAP", "HPVHIGH", "ADEQPAP" Last Mammogram: n/a Last Colonoscopy: n/a  Obstetric History OB History  Gravida Para Term Preterm AB Living  4 3 2 1 1 4   SAB IAB Ectopic Multiple Live Births  1   1 3     # Outcome Date GA Lbr Len/2nd Weight Sex Type Anes PTL Lv  4 Term 07/18/15 [redacted]w[redacted]d  7 lb 3.3 oz (3.269 kg) F CS-LTranv Spinal  LIV  3A Preterm 05/01/11    M CS-LTranv     3B Preterm 05/01/11    M CS-LTranv   LIV  2 Term 07/19/09    F Vag-Spont   LIV  1 SAB             Past Medical History:  Diagnosis Date   Headache    pregnancy induced   Vaginal Pap smear, abnormal     Past Surgical History:  Procedure Laterality Date   CESAREAN SECTION     CESAREAN SECTION N/A 07/18/2015   Procedure: CESAREAN SECTION;  Surgeon: Osborn Coho, MD;  Location: [facility];  Service: Obstetrics;  Laterality: N/A;   KNEE SURGERY     TUBAL LIGATION Bilateral 07/18/2015   Procedure: BILATERAL TUBAL LIGATION;  Surgeon: Osborn Coho, MD;  Location: [facility];  Service: Obstetrics;  Laterality: Bilateral;    Current Outpatient Medications on File Prior to Visit  Medication Sig Dispense Refill   cetirizine (ZYRTEC) 10 MG tablet Take 10 mg by mouth daily.     ibuprofen (ADVIL,MOTRIN) 600 MG tablet Take 1 tablet (600 mg total) by mouth every 6 (six) hours as needed. 30 tablet 2   oxyCODONE-acetaminophen (PERCOCET/ROXICET) 5-325 MG tablet Take 1-2 tablets by mouth every 8 (eight) hours as needed for severe pain (pain score 7-10). (Patient not taking: Reported on 10/14/2023) 8 tablet 0   Prenatal Vit-Fe Fumarate-FA (PRENATAL COMPLETE) 14-0.4 MG TABS One a day (Patient taking differently: Take 2 tablets by mouth daily. One a day) 60 each 2   senna-docusate (SENOKOT-S) 8.6-50 MG tablet Take 2  tablets by mouth at bedtime as needed for mild constipation. (Patient not taking: Reported on 10/14/2023) 30 tablet 1   No current facility-administered medications on file prior to visit.    Allergies  Allergen Reactions   Penicillins Anaphylaxis and Hives    Any type of "cillin" Has patient had a PCN reaction causing immediate rash, facial/tongue/throat swelling, SOB or lightheadedness with hypotension: Yes Has patient had a PCN reaction causing severe rash involving mucus membranes or skin necrosis: No Has patient had a PCN reaction that required hospitalization No Has patient had a PCN reaction occurring within the last 10 years: No If all of the above answers are "NO", then may proceed with Cephalosporin use.    Procardia [Nifedipine] Other (See Comments)    Social History:  reports that she has quit smoking. Her smoking use included cigarettes. She has never used smokeless tobacco. She reports current alcohol use. She reports that she does not use drugs.  Family History  Problem Relation Age of Onset   Diabetes Father     The following portions of the patient's history were reviewed and updated as appropriate: allergies, current medications, past family history, past medical history, past social history, past surgical history and problem list.  Review of Systems Pertinent items noted in HPI and remainder of comprehensive ROS otherwise negative.  Physical Exam:  BP 110/71   Pulse 81   Ht 5\' 6"  (1.676 m)   Wt 176 lb (79.8 kg)   LMP 09/25/2023 (Exact Date)   BMI 28.41 kg/m  Physical Exam Vitals and nursing note reviewed. Exam conducted with a chaperone present.  Constitutional:      Appearance: Normal appearance.  Pulmonary:     Effort: Pulmonary effort is normal.  Chest:  Breasts:    Right: Normal.     Left: Normal.  Abdominal:     Palpations: Abdomen is soft.  Genitourinary:    General: Normal vulva.     Exam position: Lithotomy position.  Neurological:      Mental Status: She is alert.    Endometrial Biopsy Procedure  Patient identified, informed consent performed,  indication reviewed, consent signed.  Reviewed risk of perforation, pain, bleeding, insufficient sample, etc were reviewd. Time out was performed.  Urine pregnancy test negative.  Speculum placed in the vagina.  Cervix visualized.  Cleaned with Betadine x 2.  Anterior cervix grasped anteriorly with a single tooth tenaculum.  Paracervical block was not administered.  Endometrial pipelle was used to draw up 1cc of 1% lidocaine, introduced into the cervical os and instilled into the endometrial cavity.  The pipelle was passed twice without difficulty and sample obtained. Tenaculum was removed, good hemostasis noted.  Patient tolerated procedure well.  Patient was given  post-procedure instructions.     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 Noted that mainstay of treatment is ovulatory suppression or ultimately ovarian removal. Patient expresses hesitancy with using hormonal intervention due to prior negative experience with using any form of hormonal intervention. Patient interested in diagnostic laparoscopy to see if there is endometriosis present prior to committing to any major procedures.  - Manage pain with acetaminophen or ibuprofen.  Heavy Menstrual Bleeding Considering endometrial ablation to manage bleeding. Informed of 80% success rate and potential for periods to return in 8-10 years given age - Now s/p uncomplicated endometrial biopsy  - Noted that endometrial biopsy only intended to help with menstrual flow but may not necessarily help with pain.  Sexually Transmitted Infection (STI) screening Requested STI testing due to time elapsed since last check-up. - Perform STI testing.  Follow-up Pending endometrial biopsy  results, will plan for endometrial ablation with diagnostic laparoscopy      Patient desires surgical management with endometrial ablation and diagnostic laparoscopy.  The risks of surgery were discussed in detail with the patient including but not limited to: bleeding which may require transfusion or reoperation; infection which may require prolonged hospitalization or re-hospitalization and antibiotic therapy; injury to bowel, bladder, ureters and major vessels or other surrounding organs which may lead to other procedures; formation of adhesions; need for additional procedures including laparotomy or subsequent procedures secondary to intraoperative injury or abnormal pathology; thromboembolic phenomenon; incisional problems and other postoperative or anesthesia complications.  Patient was told that the likelihood that her condition and symptoms will be treated effectively with this surgical management was moderate to high; the postoperative expectations were also discussed in detail. The patient also understands the alternative treatment options which were discussed in full. All questions were answered.  She was told that she will be contacted by our surgical scheduler regarding the time and date of her surgery; routine preoperative instructions will be given to her by the preoperative nursing team.    Printed patient education handouts about the procedure were given to the patient to review at home.   Routine preventative health maintenance measures emphasized. Please refer to After Visit Summary for other counseling recommendations.   Follow-up: No follow-ups on file.      Lorriane Shire, MD Obstetrician & Gynecologist, Faculty Practice [facility] for [employer], [facility]

## 2023-10-14 NOTE — Progress Notes (Addendum)
 37 y.o.New GYN presents for AEX/PAP/STD screening.  UPT Negative

## 2023-10-15 ENCOUNTER — Encounter: Payer: Self-pay | Admitting: Obstetrics and Gynecology

## 2023-10-15 ENCOUNTER — Other Ambulatory Visit: Payer: Self-pay | Admitting: Obstetrics and Gynecology

## 2023-10-15 DIAGNOSIS — N939 Abnormal uterine and vaginal bleeding, unspecified: Secondary | ICD-10-CM

## 2023-10-15 DIAGNOSIS — N946 Dysmenorrhea, unspecified: Secondary | ICD-10-CM

## 2023-10-15 LAB — CERVICOVAGINAL ANCILLARY ONLY
Bacterial Vaginitis (gardnerella): NEGATIVE
Candida Glabrata: NEGATIVE
Candida Vaginitis: NEGATIVE
Chlamydia: NEGATIVE
Comment: NEGATIVE
Comment: NEGATIVE
Comment: NEGATIVE
Comment: NEGATIVE
Comment: NEGATIVE
Comment: NORMAL
Neisseria Gonorrhea: NEGATIVE
Trichomonas: NEGATIVE

## 2023-10-15 LAB — SURGICAL PATHOLOGY

## 2023-10-19 LAB — CYTOLOGY - PAP
Comment: NEGATIVE
Diagnosis: NEGATIVE
High risk HPV: NEGATIVE

## 2023-11-11 ENCOUNTER — Telehealth: Payer: Self-pay | Admitting: Obstetrics and Gynecology

## 2023-11-11 NOTE — Telephone Encounter (Signed)
 Pt called stated she hasn't heard back from surgery scheduler and wanted to know when her surgery would be schedule. Surgery scheduler contact information was given to patient.

## 2023-11-19 ENCOUNTER — Telehealth: Payer: Self-pay

## 2023-11-19 NOTE — Telephone Encounter (Signed)
 I called patient to schedule surgery w/ Dr. Briscoe Deutscher on 01/10/24. Patient confirmed her availability and asked to be scheduled at 1 pm and will arrive at 11 am. I provided pre-op instructions and surgery details by phone. Written details will be sent to patient's Mychart acct.

## 2023-11-30 ENCOUNTER — Encounter: Payer: Medicaid Other | Admitting: Obstetrics and Gynecology

## 2023-12-08 ENCOUNTER — Encounter: Payer: Self-pay | Admitting: Obstetrics and Gynecology

## 2023-12-08 ENCOUNTER — Telehealth (INDEPENDENT_AMBULATORY_CARE_PROVIDER_SITE_OTHER): Admitting: Obstetrics and Gynecology

## 2023-12-08 DIAGNOSIS — N939 Abnormal uterine and vaginal bleeding, unspecified: Secondary | ICD-10-CM | POA: Diagnosis not present

## 2023-12-08 DIAGNOSIS — N946 Dysmenorrhea, unspecified: Secondary | ICD-10-CM | POA: Diagnosis not present

## 2023-12-08 NOTE — Progress Notes (Signed)
 GYNECOLOGY VIRTUAL VISIT ENCOUNTER NOTE  Provider location: [facility] for [facility]   Patient location: Home  I connected with Monica Le on 12/08/23 at  2:55 PM EDT by MyChart Video Encounter and verified that I am speaking with the correct person using two identifiers.   I discussed the limitations, risks, security and privacy concerns of performing an evaluation and management service virtually and the availability of in person appointments. I also discussed with the patient that there may be a patient responsible charge related to this service. The patient expressed understanding and agreed to proceed.   History:  Monica Le is a 38 y.o. [phone] female being evaluated today for questions regarding surgery. Has questions regarding procedure and follow up. Had a brief concern regarding her insurance but that has been resolved. She would like to proceed with diagnostic laparoscopy and endometrial ablation for now.   No problems with APAP/NSAID/oxycodone ,boyfriend will be transportation.      Past Medical History:  Diagnosis Date   Headache    pregnancy induced   Vaginal Pap smear, abnormal    Past Surgical History:  Procedure Laterality Date   CESAREAN SECTION     CESAREAN SECTION N/A 07/18/2015   Procedure: CESAREAN SECTION;  Surgeon: Renea Carrion, MD;  Location: [facility];  Service: Obstetrics;  Laterality: N/A;   KNEE SURGERY     TUBAL LIGATION Bilateral 07/18/2015   Procedure: BILATERAL TUBAL LIGATION;  Surgeon: Renea Carrion, MD;  Location: [facility];  Service: Obstetrics;  Laterality: Bilateral;   The following portions of the patient's history were reviewed and updated as appropriate: allergies, current medications, past family history, past medical history, past social history, past surgical history and problem list.   Health Maintenance:      Component Value Date/Time   DIAGPAP  10/14/2023 1042    - Negative for intraepithelial lesion  or malignancy (NILM)   HPVHIGH Negative 10/14/2023 1042   ADEQPAP  10/14/2023 1042    Satisfactory for evaluation; transformation zone component PRESENT.     Review of Systems:  Pertinent items noted in HPI and remainder of comprehensive ROS otherwise negative.  Physical Exam:   General:  Alert, oriented and cooperative. Patient appears to be in no acute distress.  Mental Status: Normal mood and affect. Normal behavior. Normal judgment and thought content.   Respiratory: Normal respiratory effort, no problems with respiration noted  Rest of physical exam deferred due to type of encounter  Labs and Imaging FINAL MICROSCOPIC DIAGNOSIS:   A. ENDOMETRIUM, BIOPSY:  Benign early secretory phase endometrium, postovulatory day 3  Negative for polyp, breakdown, atypia, hyperplasia and carcinoma    Assessment and Plan:     1. Dysmenorrhea (Primary) 2. Abnormal uterine bleeding (AUB) Will proceed with diagnostic laparoscopy and endometrial ablation as planned. Reviewed anticipated postop recovery including return to work in about 1 week barring any complications. Discussed anticipate monitoring menses for changes in flow over the next 4-6 months. Pathology from any biopsies concerning for endometriosis should return within a week.      Patient desires surgical management with dx lsc and endometrial ablation.  The risks of surgery were discussed in detail with the patient including but not limited to: bleeding which may require transfusion or reoperation; infection which may require prolonged hospitalization or re-hospitalization and antibiotic therapy; injury to bowel, bladder, ureters and major vessels or other surrounding organs which may lead to other procedures; formation of adhesions; need for additional procedures including laparotomy  or subsequent procedures secondary to intraoperative injury or abnormal pathology; thromboembolic phenomenon; incisional problems and other postoperative or  anesthesia complications.  Patient was told that the likelihood that her condition and symptoms will be treated effectively with this surgical management was moderate to high; the postoperative expectations were also discussed in detail. The patient also understands the alternative treatment options which were discussed in full. All questions were answered.  She was told that she will be contacted by our surgical scheduler regarding the time and date of her surgery; routine preoperative instructions will be given to her by the preoperative nursing team.    Printed patient education handouts about the procedure were given to the patient to review at home.    I discussed the assessment and treatment plan with the patient. The patient was provided an opportunity to ask questions and all were answered. The patient agreed with the plan and demonstrated an understanding of the instructions.   The patient was advised to call back or seek an in-person evaluation/go to the ED if the symptoms worsen or if the condition fails to improve as anticipated.  I provided 5 minutes of face-to-face time during this encounter. I also spent 5 minutes dedicated to the care of this patient including pre-visit review of records, post visit ordering of medications and appropriate tests or procedures, coordinating care and documenting this visit encounter.    Kiki Pelton, MD [facility] [employer], [facility]

## 2023-12-23 ENCOUNTER — Telehealth: Payer: Self-pay | Admitting: Obstetrics and Gynecology

## 2023-12-27 ENCOUNTER — Telehealth: Payer: Self-pay | Admitting: Obstetrics and Gynecology

## 2023-12-27 ENCOUNTER — Telehealth: Payer: Self-pay

## 2023-12-27 NOTE — Telephone Encounter (Signed)
 I called patient to move her surgery up to 12/30/23 due to the plan expiring on 01/08/24. Patient declined due to her job. 01/10/24 surgery with Dr. Elester Grim was canceled.

## 2024-01-10 ENCOUNTER — Ambulatory Visit (HOSPITAL_COMMUNITY): Admit: 2024-01-10 | Admitting: Obstetrics and Gynecology

## 2024-01-10 SURGERY — ABLATION, ENDOMETRIUM, HYSTEROSCOPIC
Anesthesia: Choice
